# Patient Record
Sex: Male | Born: 1950 | Race: White | Hispanic: No | Marital: Married | Smoking: Never smoker
Health system: Southern US, Community
[De-identification: ages and names within clinical notes are randomized; demographics above are authoritative.]

## PROBLEM LIST (undated history)

## (undated) DIAGNOSIS — K922 Gastrointestinal hemorrhage, unspecified: Secondary | ICD-10-CM

## (undated) DIAGNOSIS — D493 Neoplasm of unspecified behavior of breast: Secondary | ICD-10-CM

## (undated) DIAGNOSIS — I2510 Atherosclerotic heart disease of native coronary artery without angina pectoris: Secondary | ICD-10-CM

## (undated) DIAGNOSIS — E65 Localized adiposity: Secondary | ICD-10-CM

## (undated) DIAGNOSIS — K219 Gastro-esophageal reflux disease without esophagitis: Secondary | ICD-10-CM

## (undated) DIAGNOSIS — N419 Inflammatory disease of prostate, unspecified: Secondary | ICD-10-CM

## (undated) DIAGNOSIS — I10 Essential (primary) hypertension: Secondary | ICD-10-CM

## (undated) DIAGNOSIS — Z8719 Personal history of other diseases of the digestive system: Secondary | ICD-10-CM

## (undated) DIAGNOSIS — Z951 Presence of aortocoronary bypass graft: Secondary | ICD-10-CM

## (undated) DIAGNOSIS — Z8711 Personal history of peptic ulcer disease: Secondary | ICD-10-CM

## (undated) HISTORY — DX: Neoplasm of unspecified behavior of breast: D49.3

## (undated) HISTORY — PX: IRRIGATION AND DEBRIDEMENT SEBACEOUS CYST: SHX5255

## (undated) HISTORY — DX: Personal history of peptic ulcer disease: Z87.11

## (undated) HISTORY — DX: Atherosclerotic heart disease of native coronary artery without angina pectoris: I25.10

## (undated) HISTORY — DX: Localized adiposity: E65

## (undated) HISTORY — DX: Presence of aortocoronary bypass graft: Z95.1

## (undated) HISTORY — DX: Personal history of other diseases of the digestive system: Z87.19

## (undated) HISTORY — PX: CARDIAC CATHETERIZATION: SHX172

---

## 1978-02-14 DIAGNOSIS — K922 Gastrointestinal hemorrhage, unspecified: Secondary | ICD-10-CM

## 1978-02-14 HISTORY — DX: Gastrointestinal hemorrhage, unspecified: K92.2

## 2005-07-22 ENCOUNTER — Emergency Department: Payer: Self-pay | Admitting: Emergency Medicine

## 2005-09-27 ENCOUNTER — Ambulatory Visit: Payer: Self-pay | Admitting: Physician Assistant

## 2006-01-04 ENCOUNTER — Encounter: Payer: Self-pay | Admitting: Family Medicine

## 2007-06-01 ENCOUNTER — Ambulatory Visit: Payer: Self-pay | Admitting: Family Medicine

## 2007-06-01 DIAGNOSIS — K219 Gastro-esophageal reflux disease without esophagitis: Secondary | ICD-10-CM

## 2007-06-01 DIAGNOSIS — M1990 Unspecified osteoarthritis, unspecified site: Secondary | ICD-10-CM | POA: Insufficient documentation

## 2007-06-01 DIAGNOSIS — J329 Chronic sinusitis, unspecified: Secondary | ICD-10-CM | POA: Insufficient documentation

## 2007-06-01 DIAGNOSIS — K279 Peptic ulcer, site unspecified, unspecified as acute or chronic, without hemorrhage or perforation: Secondary | ICD-10-CM | POA: Insufficient documentation

## 2007-06-01 DIAGNOSIS — I10 Essential (primary) hypertension: Secondary | ICD-10-CM | POA: Insufficient documentation

## 2007-06-01 DIAGNOSIS — M7710 Lateral epicondylitis, unspecified elbow: Secondary | ICD-10-CM | POA: Insufficient documentation

## 2007-06-01 DIAGNOSIS — J309 Allergic rhinitis, unspecified: Secondary | ICD-10-CM | POA: Insufficient documentation

## 2007-06-15 ENCOUNTER — Ambulatory Visit: Payer: Self-pay | Admitting: Internal Medicine

## 2007-06-15 ENCOUNTER — Telehealth (INDEPENDENT_AMBULATORY_CARE_PROVIDER_SITE_OTHER): Payer: Self-pay | Admitting: *Deleted

## 2007-06-27 ENCOUNTER — Telehealth: Payer: Self-pay | Admitting: Family Medicine

## 2007-07-22 ENCOUNTER — Emergency Department: Payer: Self-pay | Admitting: Emergency Medicine

## 2007-07-25 ENCOUNTER — Encounter: Payer: Self-pay | Admitting: Family Medicine

## 2007-08-03 ENCOUNTER — Ambulatory Visit: Payer: Self-pay | Admitting: Family Medicine

## 2007-08-03 DIAGNOSIS — R7309 Other abnormal glucose: Secondary | ICD-10-CM | POA: Insufficient documentation

## 2007-08-03 DIAGNOSIS — E785 Hyperlipidemia, unspecified: Secondary | ICD-10-CM

## 2007-08-03 LAB — CONVERTED CEMR LAB
ALT: 34 units/L (ref 0–53)
AST: 23 units/L (ref 0–37)
Bilirubin, Direct: 0.2 mg/dL (ref 0.0–0.3)
CO2: 29 meq/L (ref 19–32)
Chloride: 103 meq/L (ref 96–112)
GFR calc non Af Amer: 74 mL/min
LDL Cholesterol: 142 mg/dL — ABNORMAL HIGH (ref 0–99)
Potassium: 4 meq/L (ref 3.5–5.1)
Sodium: 138 meq/L (ref 135–145)
Total Bilirubin: 1.4 mg/dL — ABNORMAL HIGH (ref 0.3–1.2)
Total CHOL/HDL Ratio: 6.9

## 2007-08-24 ENCOUNTER — Ambulatory Visit: Payer: Self-pay | Admitting: Family Medicine

## 2007-09-06 ENCOUNTER — Telehealth: Payer: Self-pay | Admitting: Family Medicine

## 2007-09-21 ENCOUNTER — Ambulatory Visit: Payer: Self-pay | Admitting: Family Medicine

## 2007-09-24 ENCOUNTER — Telehealth: Payer: Self-pay | Admitting: Family Medicine

## 2007-09-26 ENCOUNTER — Telehealth: Payer: Self-pay | Admitting: Family Medicine

## 2007-09-28 ENCOUNTER — Ambulatory Visit: Payer: Self-pay | Admitting: Family Medicine

## 2007-10-01 LAB — CONVERTED CEMR LAB
BUN: 18 mg/dL (ref 6–23)
CO2: 29 meq/L (ref 19–32)
Calcium: 9.3 mg/dL (ref 8.4–10.5)
GFR calc Af Amer: 89 mL/min
Glucose, Bld: 107 mg/dL — ABNORMAL HIGH (ref 70–99)

## 2007-10-08 ENCOUNTER — Telehealth: Payer: Self-pay | Admitting: Family Medicine

## 2007-11-16 ENCOUNTER — Ambulatory Visit: Payer: Self-pay | Admitting: Family Medicine

## 2007-11-21 LAB — CONVERTED CEMR LAB
Direct LDL: 148 mg/dL
Glucose, Bld: 101 mg/dL — ABNORMAL HIGH (ref 70–99)
HDL: 29.9 mg/dL — ABNORMAL LOW (ref >39.0)
Total CHOL/HDL Ratio: 6.8
Triglycerides: 57 mg/dL (ref 0–149)

## 2007-11-23 ENCOUNTER — Ambulatory Visit: Payer: Self-pay | Admitting: Family Medicine

## 2007-11-23 DIAGNOSIS — G4700 Insomnia, unspecified: Secondary | ICD-10-CM

## 2007-11-30 ENCOUNTER — Ambulatory Visit: Payer: Self-pay | Admitting: Family Medicine

## 2007-11-30 LAB — CONVERTED CEMR LAB
OCCULT 1: NEGATIVE
OCCULT 2: NEGATIVE

## 2007-12-03 ENCOUNTER — Ambulatory Visit: Payer: Self-pay | Admitting: Family Medicine

## 2007-12-06 ENCOUNTER — Encounter (INDEPENDENT_AMBULATORY_CARE_PROVIDER_SITE_OTHER): Payer: Self-pay | Admitting: *Deleted

## 2007-12-10 ENCOUNTER — Telehealth: Payer: Self-pay | Admitting: Family Medicine

## 2007-12-10 DIAGNOSIS — R05 Cough: Secondary | ICD-10-CM

## 2007-12-14 ENCOUNTER — Encounter: Payer: Self-pay | Admitting: Family Medicine

## 2007-12-21 ENCOUNTER — Ambulatory Visit: Payer: Self-pay | Admitting: Family Medicine

## 2007-12-21 DIAGNOSIS — F341 Dysthymic disorder: Secondary | ICD-10-CM

## 2007-12-25 ENCOUNTER — Emergency Department: Payer: Self-pay | Admitting: Emergency Medicine

## 2008-01-25 ENCOUNTER — Telehealth: Payer: Self-pay | Admitting: Family Medicine

## 2008-02-07 ENCOUNTER — Telehealth: Payer: Self-pay | Admitting: Family Medicine

## 2008-02-15 HISTORY — PX: NASAL SINUS SURGERY: SHX719

## 2008-03-03 ENCOUNTER — Telehealth: Payer: Self-pay | Admitting: Family Medicine

## 2008-03-25 ENCOUNTER — Ambulatory Visit: Payer: Self-pay | Admitting: Family Medicine

## 2008-05-30 ENCOUNTER — Telehealth: Payer: Self-pay | Admitting: Family Medicine

## 2008-06-13 ENCOUNTER — Telehealth: Payer: Self-pay | Admitting: Family Medicine

## 2008-07-16 ENCOUNTER — Ambulatory Visit: Payer: Self-pay | Admitting: Family Medicine

## 2008-07-16 DIAGNOSIS — K047 Periapical abscess without sinus: Secondary | ICD-10-CM

## 2008-10-08 ENCOUNTER — Telehealth: Payer: Self-pay | Admitting: Family Medicine

## 2009-01-02 ENCOUNTER — Ambulatory Visit: Payer: Self-pay | Admitting: Family Medicine

## 2009-01-06 LAB — CONVERTED CEMR LAB
ALT: 30 units/L (ref 0–53)
AST: 24 units/L (ref 0–37)
Albumin: 4.3 g/dL (ref 3.5–5.2)
BUN: 21 mg/dL (ref 6–23)
Chloride: 104 meq/L (ref 96–112)
Cholesterol: 207 mg/dL — ABNORMAL HIGH (ref 0–200)
Creatinine, Ser: 1.1 mg/dL (ref 0.4–1.5)
Glucose, Bld: 106 mg/dL — ABNORMAL HIGH (ref 70–99)
Potassium: 3.7 meq/L (ref 3.5–5.1)
Total Bilirubin: 1.1 mg/dL (ref 0.3–1.2)
Triglycerides: 71 mg/dL (ref 0.0–149.0)

## 2009-01-15 ENCOUNTER — Ambulatory Visit: Payer: Self-pay | Admitting: Family Medicine

## 2009-02-14 DIAGNOSIS — N419 Inflammatory disease of prostate, unspecified: Secondary | ICD-10-CM

## 2009-02-14 HISTORY — DX: Inflammatory disease of prostate, unspecified: N41.9

## 2009-03-06 ENCOUNTER — Telehealth: Payer: Self-pay | Admitting: Family Medicine

## 2009-04-10 ENCOUNTER — Ambulatory Visit: Payer: Self-pay | Admitting: Family Medicine

## 2009-04-14 LAB — CONVERTED CEMR LAB
Direct LDL: 158.6 mg/dL
HDL: 36.3 mg/dL — ABNORMAL LOW (ref >39.00)
Total CHOL/HDL Ratio: 6
Triglycerides: 117 mg/dL (ref 0.0–149.0)
VLDL: 23.4 mg/dL (ref 0.0–40.0)

## 2009-04-24 ENCOUNTER — Ambulatory Visit: Payer: Self-pay | Admitting: Family Medicine

## 2009-04-27 ENCOUNTER — Emergency Department: Payer: Self-pay | Admitting: Emergency Medicine

## 2009-04-29 ENCOUNTER — Ambulatory Visit: Payer: Self-pay | Admitting: Family Medicine

## 2009-04-29 LAB — CONVERTED CEMR LAB: Cholesterol, target level: 200 mg/dL

## 2009-05-14 ENCOUNTER — Ambulatory Visit: Payer: Self-pay | Admitting: Otolaryngology

## 2009-09-18 ENCOUNTER — Telehealth: Payer: Self-pay | Admitting: Family Medicine

## 2010-03-16 NOTE — Progress Notes (Signed)
Summary: stung by several bees  Phone Note Call from Patient   Caller: Spouse Call For: Kerby Nora MD Summary of Call: Pt's wife states pt was stung several times on his face this morning by some type of bees.  His face is very swollen, no breathing problems. Has taken 2 benedryl.   Advised wife that pt should go to urgent care for evaluation, since face is severly swollen. Initial call taken by: Lowella Petties CMA,  September 18, 2009 12:42 PM  Follow-up for Phone Call        Can add him to 4:15 slot here if he has not already gone to urgent care.  Follow-up by: Kerby Nora MD,  September 18, 2009 12:51 PM  Additional Follow-up for Phone Call Additional follow up Details #1::        Patient already been to urgent care.Consuello Masse CMA   Additional Follow-up by: Benny Lennert CMA Duncan Dull),  September 18, 2009 2:13 PM

## 2010-03-16 NOTE — Assessment & Plan Note (Signed)
Summary: ? SINUS INFECTION/ 2:15   Vital Signs:  Patient profile:   60 year old male Height:      73 inches Weight:      239 pounds BMI:     31.65 Temp:     97.6 degrees F oral Pulse rate:   92 / minute Pulse rhythm:   regular BP sitting:   142 / 78  (left arm) Cuff size:   large  Vitals Entered By: Delilah Shan CMA Duncan Dull) (April 24, 2009 2:14 PM) CC: ? sinus infection   History of Present Illness: 60 yo with recurrent sinusitis comes in with 2 weeks of worsening sinus pressure. Has been on amoxicillin three times over past year for similar symptoms. Went to ENT years ago, told he needed sinus surgery but he backed out at a last minute. Felt feverish last night.  No cough. No SOB or CP. Pressure behind eyes right now.  Ibuprofen helps a little. No blurred vision or pain with chewing.  Current Medications (verified): 1)  Hydrochlorothiazide 25 Mg Tabs (Hydrochlorothiazide) .... Take 1 Tablet By Mouth Once A Day 2)  Metoprolol Succinate 50 Mg Xr24h-Tab (Metoprolol Succinate) .... Take 1 Tablet By Mouth Once A Day 3)  Sertraline Hcl 50 Mg Tabs (Sertraline Hcl) .Marland Kitchen.. 1 Tab By Mouth Daily 4)  Omeprazole 40 Mg Cpdr (Omeprazole) .Marland Kitchen.. 1 Tab By Mouth Daily 5)  Augmentin 875-125 Mg Tabs (Amoxicillin-Pot Clavulanate) .Marland Kitchen.. 1 By Mouth 2 Times Daily X 10 Days  Allergies: 1)  ! Codeine  Review of Systems      See HPI General:  Complains of chills; denies fever. ENT:  Complains of nasal congestion and sinus pressure; denies sore throat. CV:  Denies chest pain or discomfort. Resp:  Denies cough, shortness of breath, sputum productive, and wheezing.  Physical Exam  General:  Overwieght man with central obesity in NAD Ears:  External ear exam shows no significant lesions or deformities.  Otoscopic examination reveals clear canals, tympanic membranes are intact bilaterally without bulging, retraction, inflammation or discharge. Hearing is grossly normal bilaterally. Nose:  nasal  dischargemucosal pallor.   sinuses tender throughout Mouth:  MMM, poor dentition Lungs:  Normal respiratory effort, chest expands symmetrically. Lungs are clear to auscultation, no crackles or wheezes. Heart:  Normal rate and regular rhythm. S1 and S2 normal without gallop, murmur, click, rub or other extra sounds. Extremities:  no edema  Cervical Nodes:  No lymphadenopathy noted Psych:  normally interactive and good eye contact.     Impression & Recommendations:  Problem # 1:  SINUSITIS, CHRONIC (ICD-473.9) Assessment Deteriorated Will try Augmentin, stronly encouraged him to follow up with ENT to proceed with sinus surgery. Continue supportive care. The following medications were removed from the medication list:    Amoxicillin 500 Mg Caps (Amoxicillin) .Marland Kitchen... 2 tab by mouth two times a day x 14 days His updated medication list for this problem includes:    Augmentin 875-125 Mg Tabs (Amoxicillin-pot clavulanate) .Marland Kitchen... 1 by mouth 2 times daily x 10 days  Complete Medication List: 1)  Hydrochlorothiazide 25 Mg Tabs (Hydrochlorothiazide) .... Take 1 tablet by mouth once a day 2)  Metoprolol Succinate 50 Mg Xr24h-tab (Metoprolol succinate) .... Take 1 tablet by mouth once a day 3)  Sertraline Hcl 50 Mg Tabs (Sertraline hcl) .Marland Kitchen.. 1 tab by mouth daily 4)  Omeprazole 40 Mg Cpdr (Omeprazole) .Marland Kitchen.. 1 tab by mouth daily 5)  Augmentin 875-125 Mg Tabs (Amoxicillin-pot clavulanate) .Marland Kitchen.. 1 by mouth 2 times daily  x 10 days Prescriptions: AUGMENTIN 875-125 MG TABS (AMOXICILLIN-POT CLAVULANATE) 1 by mouth 2 times daily x 10 days  #20 x 0   Entered and Authorized by:   Ruthe Mannan MD   Signed by:   Ruthe Mannan MD on 04/24/2009   Method used:   Electronically to        [facility] [address] (retail)       [address]       [place], [place]  [address]       Ph: [phone]       Fax: [phone]   RxID:   [id number]   Current Allergies (reviewed  today): ! CODEINE

## 2010-03-16 NOTE — Progress Notes (Signed)
Summary: ? if had the flu  Phone Note Call from Patient Call back at [phone]   Caller: Patient Call For: Kerby Nora MD Summary of Call: Pt's wife diagnosed with flu and now pt  has productive cough with yellow mucus, pt feels achy and so tired can't be up and about. No sorethroat, but pt feels warm  has not checked fever. Pt is not having any wheezing or no problem getting his breath. Head is congested. Pt would like med called in. Pt uses [facility] [address] [phone]. Please advise.  Initial call taken by: Lewanda Rife LPN,  March 06, 2009 1:36 PM  Follow-up for Phone Call        How long has he had symptoms? if flu... only thing available  is tamiflu, but only helpful if used in first 48-72 hours of symptoms.  Follow-up by: Kerby Nora MD,  March 06, 2009 2:05 PM  Additional Follow-up for Phone Call Additional follow up Details #1::        just about 2 days ago Additional Follow-up by: Benny Lennert CMA Duncan Dull),  March 06, 2009 2:09 PM    Additional Follow-up for Phone Call Additional follow up Details #2::    Sent in tamiflu if pt interested..main treatment is fluids, rest and symptoms control...if severe nighttime cough, we can also call in cough suppressant.  Follow-up by: Kerby Nora MD,  March 06, 2009 2:18 PM  Additional Follow-up for Phone Call Additional follow up Details #3:: Details for Additional Follow-up Action Taken: patient advised.Consuello Masse CMA  Additional Follow-up by: Benny Lennert CMA Duncan Dull),  March 06, 2009 2:28 PM  New/Updated Medications: TAMIFLU 75 MG CAPS (OSELTAMIVIR PHOSPHATE) 1 tab by mouth twice  daily x 5 days Prescriptions: TAMIFLU 75 MG CAPS (OSELTAMIVIR PHOSPHATE) 1 tab by mouth twice  daily x 5 days  #10 x 0   Entered and Authorized by:   Kerby Nora MD   Signed by:   Kerby Nora MD on 03/06/2009   Method used:   Electronically to        [facility] [address] (retail)       [address], [address]  [place], [place]  [address]       Ph: [phone]       Fax: [phone]   RxID:   [id number]

## 2010-03-16 NOTE — Assessment & Plan Note (Signed)
Summary: 3 M F/U DLO   Vital Signs:  Patient profile:   60 year old male Height:      73 inches Weight:      235.0 pounds BMI:     31.12 Temp:     98.0 degrees F oral Pulse rate:   72 / minute Pulse rhythm:   regular BP sitting:   120 / 70  (left arm) Cuff size:   large  Vitals Entered By: Benny Lennert CMA Duncan Dull) (April 29, 2009 11:01 AM)  History of Present Illness: Chief complaint 3 month follow up  ON augmentin  for 5/ 10 days for chronic sinusitis. Has appt today with ENT.Marland Kitchen in [place].  Symptoms have improved significantly.  Dizzyness resolved from IV fluid received in [facility].. last week. Feeling much better.  Mood much better .Marland Kitchenwants to wean off medciation.  NO SI, no insomnia.   Hypertension History:      He denies headache, chest pain, dyspnea with exertion, orthopnea, peripheral edema, and side effects from treatment.  Not following at home.        Positive major cardiovascular risk factors include male age 75 years old or older, hyperlipidemia, and hypertension.  Negative major cardiovascular risk factors include non-tobacco-user status.    Lipid Management History:      Positive NCEP/ATP III risk factors include male age 66 years old or older, HDL cholesterol less than 40, and hypertension.  Negative NCEP/ATP III risk factors include non-tobacco-user status.        Comments: Has been increasing walking..joined [place] and exercsiing once a week. .   Problems Prior to Update: 1)  Gerd  (ICD-530.81) 2)  Abscess, Tooth  (ICD-522.5) 3)  Anxiety Depression  (ICD-300.4) 4)  Cough, Chronic  (ICD-786.2) 5)  Insomnia  (ICD-780.52) 6)  Special Screening Malignant Neoplasm of Prostate  (ICD-V76.44) 7)  Prediabetes  (ICD-790.29) 8)  Hypercholesterolemia  (ICD-272.0) 9)  Lateral Epicondylitis of Elbow  (ICD-726.32) 10)  Allergic Rhinitis  (ICD-477.9) 11)  Sinusitis, Chronic  (ICD-473.9) 12)  Hypertension  (ICD-401.9) 13)  Peptic Ulcer Disease  (ICD-533.90) 14)  Gerd   (ICD-530.81) 15)  Osteoarthritis  (ICD-715.90)  Current Medications (verified): 1)  Hydrochlorothiazide 25 Mg Tabs (Hydrochlorothiazide) .... Take 1 Tablet By Mouth Once A Day 2)  Metoprolol Succinate 50 Mg Xr24h-Tab (Metoprolol Succinate) .... Take 1 Tablet By Mouth Once A Day 3)  Sertraline Hcl 50 Mg Tabs (Sertraline Hcl) .Marland Kitchen.. 1 Tab By Mouth Daily 4)  Omeprazole 40 Mg Cpdr (Omeprazole) .Marland Kitchen.. 1 Tab By Mouth Daily 5)  Chantix 1 Mg Tabs (Varenicline Tartrate) .Marland Kitchen.. 1 Tab By Mouth Am..no Nighttime Dose.  Allergies: 1)  ! Codeine  Past History:  Past medical, surgical, family and social histories (including risk factors) reviewed, and no changes noted (except as noted below).  Past Medical History: Reviewed history from 06/01/2007 and no changes required. Osteoarthritis GERD Peptic ulcer disease Hypertension Allergic rhinitis  Past Surgical History: Reviewed history from 06/01/2007 and no changes required. stress test 2008 CT scan of sinuses: blockage  Family History: Reviewed history from 06/01/2007 and no changes required. adopted  Social History: Reviewed history from 06/01/2007 and no changes required. Occupation:Plumbing/mechanical Married 3 kids: Son with DM, daughter prediabetic Never Smoked Alcohol use-yes, 6-7 beers 2-3 times a week Drug use-no Regular exercise-yes, softball 2 times a week Diet: some fast food on weekends, water  Review of Systems General:  Denies fatigue and fever. CV:  Denies chest pain or discomfort. Resp:  Denies shortness  of breath. GI:  Denies abdominal pain. GU:  Denies dysuria.  Physical Exam  General:  Overwieght man with central obesity in NAD Head:  No maxillary sinus ttp Eyes:  No corneal or conjunctival inflammation noted. EOMI. Perrla. Funduscopic exam benign, without hemorrhages, exudates or papilledema. Vision grossly normal. Ears:  External ear exam shows no significant lesions or deformities.  Otoscopic examination  reveals clear canals, tympanic membranes are intact bilaterally without bulging, retraction, inflammation or discharge. Hearing is grossly normal bilaterally. Nose:  nasal dischargemucosal pallor.   Mouth:  MMM, poor dentition Neck:  no carotid bruit or thyromegaly no cervical or supraclavicular lymphadenopathy  Lungs:  Normal respiratory effort, chest expands symmetrically. Lungs are clear to auscultation, no crackles or wheezes. Heart:  Normal rate and regular rhythm. S1 and S2 normal without gallop, murmur, click, rub or other extra sounds. Pulses:  R and L posterior tibial pulses are full and equal bilaterally  Extremities:  no edema   Impression & Recommendations:  Problem # 1:  ANXIETY DEPRESSION (ICD-300.4) Doing well, discussed weaning off medicaiton slowly. Decrease to 1/2 tab by mouth daily of sertraline  x 1-2  week then take 1/2 tab every other day x 1 week then off.    Problem # 2:  HYPERCHOLESTEROLEMIA (ICD-272.0) Inadequate control. Wishes to try lifestyle changes prior to medication.  Reviewed cholesterol diet. Increase exercise. Work on [employer]. Labs Reviewed: SGOT: 24 (01/02/2009)   SGPT: 30 (01/02/2009)  Lipid Goals: Chol Goal: 200 (04/29/2009)   HDL Goal: 40 (04/29/2009)   LDL Goal: 130 (04/29/2009)   TG Goal: 150 (04/29/2009)  Prior 10 Yr Risk Heart Disease: Not enough information (01/15/2009)   HDL:36.30 (04/10/2009), 28.30 (01/02/2009)  LDL:DEL (11/16/2007), 142 (08/03/2007)  Chol:208 (04/10/2009), 207 (01/02/2009)  Trig:117.0 (04/10/2009), 71.0 (01/02/2009)  Problem # 3:  HYPERTENSION (ICD-401.9) Well controlled. Continue current medication.  His updated medication list for this problem includes:    Hydrochlorothiazide 25 Mg Tabs (Hydrochlorothiazide) .Marland Kitchen... Take 1 tablet by mouth once a day    Metoprolol Succinate 50 Mg Xr24h-tab (Metoprolol succinate) .Marland Kitchen... Take 1 tablet by mouth once a day  BP today: 120/70 Prior BP: 142/78 (04/24/2009)  Prior 10 Yr Risk  Heart Disease: Not enough information (01/15/2009)  Labs Reviewed: K+: 3.7 (01/02/2009) Creat: : 1.1 (01/02/2009)   Chol: 208 (04/10/2009)   HDL: 36.30 (04/10/2009)   LDL: DEL (11/16/2007)   TG: 117.0 (04/10/2009)  Problem # 4:  SINUSITIS, CHRONIC (ICD-473.9) Per ENT.   Complete Medication List: 1)  Hydrochlorothiazide 25 Mg Tabs (Hydrochlorothiazide) .... Take 1 tablet by mouth once a day 2)  Metoprolol Succinate 50 Mg Xr24h-tab (Metoprolol succinate) .... Take 1 tablet by mouth once a day 3)  Sertraline Hcl 50 Mg Tabs (Sertraline hcl) .Marland Kitchen.. 1 tab by mouth daily 4)  Omeprazole 40 Mg Cpdr (Omeprazole) .Marland Kitchen.. 1 tab by mouth daily 5)  Chantix 1 Mg Tabs (Varenicline tartrate) .Marland Kitchen.. 1 tab by mouth am..no nighttime dose.  Hypertension Assessment/Plan:      The patient's hypertensive risk group is category B: At least one risk factor (excluding diabetes) with no target organ damage.  Today's blood pressure is 120/70.  His blood pressure goal is < 140/90.  Lipid Assessment/Plan:      Based on NCEP/ATP III, the patient's risk factor category is "0-1 risk factors".  The patient's lipid goals are as follows: Total cholesterol goal is 200; LDL cholesterol goal is 130; HDL cholesterol goal is 40; Triglyceride goal is 150.  His LDL cholesterol  goal has not been met.     Patient Instructions: 1)  Decrease to 1/2 tab by mouth daily of sertraline  x 1-2  week then take 1/2 tab every other day x 1 week then off.   2)  Follow BPs at home.  3)  Increase exercise. Work on [employer]. 4)  Take multivitamin, B complex.  5)  Recheck fasting LIPIDS, CMET, TSH  in 3 months Dx 272.0    6)  Please schedule a follow-up appointment in 3-4 months CPX.    Current Allergies (reviewed today): ! CODEINE

## 2011-01-03 ENCOUNTER — Ambulatory Visit: Payer: Self-pay | Admitting: Cardiovascular Disease

## 2011-01-13 HISTORY — PX: NM MYOCAR MULTIPLE W/SPECT: HXRAD626

## 2011-01-15 DIAGNOSIS — Z951 Presence of aortocoronary bypass graft: Secondary | ICD-10-CM

## 2011-01-15 HISTORY — DX: Presence of aortocoronary bypass graft: Z95.1

## 2011-01-27 ENCOUNTER — Other Ambulatory Visit: Payer: Self-pay | Admitting: Cardiology

## 2011-01-28 ENCOUNTER — Encounter (HOSPITAL_COMMUNITY): Payer: Self-pay | Admitting: Pharmacy Technician

## 2011-01-31 ENCOUNTER — Other Ambulatory Visit: Payer: Self-pay | Admitting: Cardiology

## 2011-01-31 ENCOUNTER — Ambulatory Visit
Admission: RE | Admit: 2011-01-31 | Discharge: 2011-01-31 | Disposition: A | Discharge status: Home / Self Care | Payer: PRIVATE HEALTH INSURANCE | Source: Ambulatory Visit | Attending: Cardiology | Admitting: Cardiology

## 2011-01-31 DIAGNOSIS — Z01818 Encounter for other preprocedural examination: Secondary | ICD-10-CM

## 2011-01-31 DIAGNOSIS — R0602 Shortness of breath: Secondary | ICD-10-CM | POA: Diagnosis present

## 2011-01-31 DIAGNOSIS — R9439 Abnormal result of other cardiovascular function study: Secondary | ICD-10-CM | POA: Diagnosis present

## 2011-02-01 NOTE — H&P (Addendum)
History and Physical Interval Note:  NAME:  Joel Walters   MRN: [id number] DOB:  [birth year]   ADMIT DATE: 02/02/2011   Reinhard Schack is a 60 y.o. male with a PMH of HTN, HLD, and obesity who was seen by me at [facility] for evaluation of chest pressure and shortness of breath on exertion.  He underwent TM Myoview Stress testing that demonstrated a large sized, moderate intensity perfusion defect in the basal to apical inferoseptal & inferior walls with moderate reversibility.  A defect consistent with RCA or dominant LCx distribution ischemia, and was considered to be high risk, with cardiac catheterization advised.    He has presented today for cardiac catheterization, with the diagnosis of dyspnea on exertion with chest pressure.   The various methods of treatment have been discussed with the patient and family. After consideration of risks, benefits and other options for treatment, the patient has consented to Procedure(s):  LEFT HEART CATHETERIZATION AND CORONARY ANGIOGRAPHY +/- AD HOC PERCUTANEOUS CORONARY INTERVENTION as a surgical intervention.   The patients' history has been reviewed, patient examined, no change in status from most recent note (dated 01/26/11), he is stable for planned procedure. I have reviewed the patients' chart and labs. Questions were answered to the patient's satisfaction.   BUN/Cr 15/1.11, K 4.5; WBC/Hgb/Hct/Plt  6.8/17.1/49.1/301.  General appearance: alert, cooperative, appears stated age and no distress Neck: no adenopathy, no carotid bruit, no JVD, supple, symmetrical, trachea midline and thyroid not enlarged, symmetric, no tenderness/mass/nodules Lungs: clear to auscultation bilaterally and non-labored Heart: regular rate and rhythm, S1, S2 normal, no murmur, click, rub or gallop Abdomen: soft, non-tender; bowel sounds normal; no masses,  no organomegaly Extremities: extremities normal, atraumatic, no cyanosis or edema Pulses: 2+ and symmetric Neurologic: Alert and  oriented X 3, normal strength and tone. Normal symmetric reflexes. Normal coordination and gait   PLAN: Proceed with elective LHC +/- PCI  CARDIAC CATHETERIZATION CONSENT NOTE: Performing MD:  Marykay Lex, M.D., M.S.  Procedure(s):   LEFT HEART CATHETERIZATION WITH CORONARY ANGIOGRAPY LEFT VENTRICULOGRAPHY POSSIBLE AD HOC PERCUTANEOUS CORONARY INTERVENTION.  The procedure with Risks/Benefits/Alternatives and Indications was reviewed with the patient and companion.  All questions were answered.    Risks / Complications include, but not limited to: Death, MI, CVA/TIA, VF/VT (with defibrillation), Bradycardia (need for temporary pacer placement), contrast induced nephropathy, bleeding / bruising / hematoma / pseudoaneurysm, vascular or coronary injury (with possible emergent CT or Vascular Surgery), adverse medication reactions, infection.    The patient voiced understanding and agree to proceed.   I have signed the consent form and placed it on the chart for patient signature and RN witness.     Marykay Lex, M.D., M.S. [facility] [address] [place], [place]  [address]  [phone]

## 2011-02-02 ENCOUNTER — Encounter (HOSPITAL_COMMUNITY): Payer: Self-pay | Admitting: Anesthesiology

## 2011-02-02 ENCOUNTER — Ambulatory Visit (HOSPITAL_COMMUNITY)
Admission: RE | Admit: 2011-02-02 | Discharge: 2011-02-02 | Disposition: A | Discharge status: Home / Self Care | Payer: PRIVATE HEALTH INSURANCE | Source: Ambulatory Visit | Attending: Cardiology | Admitting: Cardiology

## 2011-02-02 ENCOUNTER — Other Ambulatory Visit: Payer: Self-pay

## 2011-02-02 ENCOUNTER — Other Ambulatory Visit: Payer: Self-pay | Admitting: Cardiothoracic Surgery

## 2011-02-02 ENCOUNTER — Encounter (HOSPITAL_COMMUNITY)
Admission: RE | Disposition: A | Discharge status: Home / Self Care | Payer: Self-pay | Source: Ambulatory Visit | Attending: Cardiology

## 2011-02-02 DIAGNOSIS — I2510 Atherosclerotic heart disease of native coronary artery without angina pectoris: Secondary | ICD-10-CM | POA: Insufficient documentation

## 2011-02-02 DIAGNOSIS — R9439 Abnormal result of other cardiovascular function study: Secondary | ICD-10-CM | POA: Diagnosis present

## 2011-02-02 DIAGNOSIS — R0602 Shortness of breath: Secondary | ICD-10-CM | POA: Diagnosis present

## 2011-02-02 DIAGNOSIS — E785 Hyperlipidemia, unspecified: Secondary | ICD-10-CM | POA: Diagnosis present

## 2011-02-02 DIAGNOSIS — R7309 Other abnormal glucose: Secondary | ICD-10-CM | POA: Diagnosis present

## 2011-02-02 DIAGNOSIS — R079 Chest pain, unspecified: Secondary | ICD-10-CM | POA: Insufficient documentation

## 2011-02-02 DIAGNOSIS — I10 Essential (primary) hypertension: Secondary | ICD-10-CM | POA: Diagnosis present

## 2011-02-02 HISTORY — PX: LEFT HEART CATHETERIZATION WITH CORONARY ANGIOGRAM: SHX5451

## 2011-02-02 SURGERY — LEFT HEART CATHETERIZATION WITH CORONARY ANGIOGRAM
Anesthesia: LOCAL

## 2011-02-02 MED ORDER — NITROGLYCERIN 0.2 MG/ML ON CALL CATH LAB
INTRAVENOUS | Status: AC
  Filled 2011-02-02: qty 1

## 2011-02-02 MED ORDER — MORPHINE SULFATE 2 MG/ML IJ SOLN: 2.0000 mg | INTRAMUSCULAR | Status: DC | PRN

## 2011-02-02 MED ORDER — HEPARIN SODIUM (PORCINE) 1000 UNIT/ML IJ SOLN
INTRAMUSCULAR | Status: AC
  Filled 2011-02-02: qty 1

## 2011-02-02 MED ORDER — ASPIRIN 325 MG PO TABS: 325.0000 mg | ORAL_TABLET | Freq: Every day | ORAL | Status: DC

## 2011-02-02 MED ORDER — ROSUVASTATIN CALCIUM 20 MG PO TABS: 20.0000 mg | ORAL_TABLET | Freq: Every day | ORAL | Status: DC

## 2011-02-02 MED ORDER — SODIUM CHLORIDE 0.9 % IV SOLN
250.0000 mL | INTRAVENOUS | Status: DC | PRN
  Administered 2011-02-02: 1000 mL via INTRAVENOUS

## 2011-02-02 MED ORDER — MIDAZOLAM HCL 2 MG/2ML IJ SOLN
INTRAMUSCULAR | Status: AC
  Filled 2011-02-02: qty 2

## 2011-02-02 MED ORDER — FENTANYL CITRATE 0.05 MG/ML IJ SOLN
INTRAMUSCULAR | Status: AC
  Filled 2011-02-02: qty 2

## 2011-02-02 MED ORDER — ACETAMINOPHEN 325 MG PO TABS
650.0000 mg | ORAL_TABLET | ORAL | Status: DC | PRN
  Administered 2011-02-02: 650 mg via ORAL

## 2011-02-02 MED ORDER — SODIUM CHLORIDE 0.9 % IJ SOLN: 3.0000 mL | INTRAMUSCULAR | Status: DC | PRN

## 2011-02-02 MED ORDER — ONDANSETRON HCL 4 MG/2ML IJ SOLN: 4.0000 mg | Freq: Four times a day (QID) | INTRAMUSCULAR | Status: DC | PRN

## 2011-02-02 MED ORDER — ACETAMINOPHEN 325 MG PO TABS
ORAL_TABLET | ORAL | Status: AC
  Filled 2011-02-02: qty 2

## 2011-02-02 MED ORDER — METOPROLOL SUCCINATE ER 50 MG PO TB24: 50.0000 mg | ORAL_TABLET | Freq: Every day | ORAL | Status: DC

## 2011-02-02 MED ORDER — HYDROCHLOROTHIAZIDE 25 MG PO TABS: 25.0000 mg | ORAL_TABLET | Freq: Every day | ORAL | Status: DC

## 2011-02-02 MED ORDER — ASPIRIN 81 MG PO CHEW
CHEWABLE_TABLET | ORAL | Status: AC
  Filled 2011-02-02: qty 4

## 2011-02-02 MED ORDER — SODIUM CHLORIDE 0.9 % IV SOLN: 1.0000 mL/kg/h | INTRAVENOUS | Status: DC

## 2011-02-02 MED ORDER — DIAZEPAM 5 MG PO TABS
5.0000 mg | ORAL_TABLET | ORAL | Status: AC
  Administered 2011-02-02: 5 mg via ORAL
  Filled 2011-02-02: qty 1

## 2011-02-02 MED ORDER — HEPARIN (PORCINE) IN NACL 2-0.9 UNIT/ML-% IJ SOLN
INTRAMUSCULAR | Status: AC
  Filled 2011-02-02: qty 2000

## 2011-02-02 MED ORDER — VERAPAMIL HCL 2.5 MG/ML IV SOLN
INTRAVENOUS | Status: AC
  Filled 2011-02-02: qty 2

## 2011-02-02 MED ORDER — LIDOCAINE HCL (PF) 1 % IJ SOLN
INTRAMUSCULAR | Status: AC
  Filled 2011-02-02: qty 30

## 2011-02-02 MED ORDER — SODIUM CHLORIDE 0.9 % IV SOLN: INTRAVENOUS | Status: DC

## 2011-02-02 NOTE — Consult Note (Signed)
[address]            [place] [address]          [phone]      [name] Medical Record #[id number] Date of Birth: 08-28-1950  Referring: Dr Bryan Lemma Primary Care Dr Stanford Scotland Archinal  Chief Complaint:   Shortness of breath  History of Present Illness:    The patient is a 60 year old male with a history of hypertension obesity and hyperlipidemia as well as possible sleep apnea. Approximate 6 weeks ago he saw his primary care doctor because of vague complaints including confusion dyspnea on exertion arm discomfort and excessive sweating. He's also had some episodes of lightheadedness. He notes that he's had increasing fatigue. He notes since the original onset of symptoms he actually feels better now. He denies any definite chest pain. He was referred to Dr. Windy Carina. Myoview stress test was performed leading to cardiac catheterization today. I've been asked to see him in consideration of coronary artery bypass grafting.  He's had no known previous history of coronary artery disease denies previous stroke denies diabetes denies smoking history does have a known history of hyperlipidemia currently untreated and a history of hypertension.      Current Activity/ Functional Status: Patient is independent with mobility/ambulation, transfers, ADL's, IADL's.   Past medical history Hypertension  hyperlipidemia Question of sleep apnea though this has not been fully evaluated   Past surgical history Sinus surgery in 2011  Family history: Patient notes that he is adopted and so does not know any details of family history he does have 3 children 1 who at age 65 has diabetes.  History   Social History  . Marital Status: Single dating long term partner   Occupational History   Patient works in Production designer, theatre/television/film and apartment complex    Social History Main Topics  . Smoking status:  nonsmoker   . Smokeless tobacco: Not on file  .  Alcohol Use:  previously was in a rock band and notes heavy alcohol use but no alcohol use for the past 5 years   . Drug Use:  denies drug use            Allergies  Allergen Reactions  . Codeine     REACTION: Jittery, itches    Prescriptions prior to admission  Medication Sig Dispense Refill  . aspirin 325 MG tablet Take 325 mg by mouth daily.        . hydrochlorothiazide (HYDRODIURIL) 25 MG tablet Take 25 mg by mouth daily.        . metoprolol (TOPROL-XL) 50 MG 24 hr tablet Take 50 mg by mouth daily.        . Multiple Vitamins-Minerals (MULTIVITAMINS THER. W/MINERALS) TABS Take 1 tablet by mouth daily.        . vitamin C (ASCORBIC ACID) 500 MG tablet Take 500 mg by mouth daily.             Review of Systems:     Cardiac Review of Systems: Y or N  Chest Pain [  n  ]  Resting SOB [n   ] Exertional SOB  [ y ]  Pollyann Kennedy [name]  ]   Pedal Edema [ n  ]    Palpitations [n  ] Syncope  [name]  ]   Presyncope [name]   ]  General Review  of Systems: [Y] = yes [  ]=no Constitional: recent weight change [ n ]; anorexia [ n ]; fatigue [name]  ]; nausea [ y ]; night sweats [name]  ]; fever [ nn ]; or chills [ n ];                                                                                                                                          Dental: poor dentition[n  ];  Eye : blurred vision [ n ]; diplopia [  n ]; vision changes [ n ];  Amaurosis fugax[ n]; Resp: cough [n  ];  wheezing[ n ];  hemoptysis[ n ]; shortness of breath[  n]; paroxysmal nocturnal dyspnea[ n ]; dyspnea on exertion[y  ]; or orthopnea[n  ];  GI:  gallstones[  ], vomiting[  ];  dysphagia[  ]; melena[  ];  hematochezia [  ]; heartburn[  ];   Hx of  Colonoscopy[ n ]; GU: kidney stones [  ]; hematuria[  ];   dysuria [  ];  nocturia[y  ];  history of     obstruction [n ];             Skin: rash, swelling[  ];, hair loss[  ];  peripheral edema[  ];  or itching[  ]; Musculosketetal: myalgias[  ];  joint swelling[  ];  joint erythema[   ];  joint pain[  ];  back pain[  ];  Heme/Lymph: bruising[  ];  bleeding[  ];  anemia[  ];  Neuro: TIA[n  ];  headaches[ n ];  stroke[n  ];  vertigo[  ];  seizures[  ];   paresthesias[y   Arm biltaerial];  difficulty walking[  n];  Psych:depression[ n ]; anxiety[ n ];  Endocrine: diabetes[ n ];  thyroid dysfunction[  n];  Immunizations: Flu [ n ]; Pneumococcal[ n ];  Other:  Physical Exam: BP 136/82  Pulse 81  Temp(Src) 97.1 F (36.2 C) (Oral)  Resp 18  Ht 6\' 1"  (1.854 m)  Wt 240 lb (108.863 kg)  BMI 31.66 kg/m2  SpO2 98%  General appearance: alert, cooperative, appears older than stated age and no distress Neurologic: intact the patient has no carotid bruits Heart: regular rate and rhythm, S1, S2 normal, no murmur, click, rub or gallop and normal apical impulse Lungs: clear to auscultation bilaterally Abdomen: soft, non-tender; bowel sounds normal; no masses,  no organomegaly Extremities: extremities normal, atraumatic, no cyanosis or edema, Homans sign is negative, no sign of DVT, no edema, redness or tenderness in the calves or thighs and no ulcers, gangrene or trophic changes Wound: rt wrist cath site without hematoma   Diagnostic Studies & Laboratory data:     Recent Radiology Findings:   Myoview stress test was performed Dr. Elissa Hefty office Baseline EKG was normal stress EKG showed no changes stress images show a large moderate intensity  perfusion defect in the basal inferior septal basal inferior and mid inferior septal mid inferior and apical inferior walls resting images reveal moderate defect reversibility ejection fraction is measured at 54%  02/02/2011  1:39 PM  PATIENT: Joel Walters 60 y.o. male  PRE-OPERATIVE DIAGNOSIS: Chest pain  POST-OPERATIVE DIAGNOSIS: Multivessel CAD  100%mRCA, 80% early mid LAD; Bifurcation Cx-OM1 80% & OM2 prox 90%; LPL-RPL collaterals fill RPL & RPDA  EF 60-65%; Inferobasal HK, normal LVEDP  Procedure(s):  LEFT HEART  CATHETERIZATION WITH CORONARY ANGIOGRAM  INTRA CORONARY NITROGLYCERIN INJECTION  Surgeon(s):  Marykay Lex  ANESTHESIA: local and IV sedation; 6mg  Versed; 150 mcg Fentanyl; PO Valium 5mg   EBL: <10 ml  LOCAL MEDICATIONS USED: LIDOCAINE 3 ml  SPECIMEN: No Specimen  TOURNIQUET: TR Band: 14 ml air; 1318 hrs.  DICTATION: .Note written in EPIC  PLAN OF CARE: Will consult CVTS to see in Short stay  PATIENT DISPOSITION: Short-stay; no d/c until sen by CT Sgx  Delay start of Pharmacological VTE agent (>24hrs) due to surgical blood loss or risk of bleeding: Not Applicable        Recent Lab Findings: Lab Results  Component Value Date   GLUCOSE 106* 01/02/2009   CHOL 208* 04/10/2009   TRIG 117.0 04/10/2009   HDL 36.30* 04/10/2009   LDLDIRECT 158.6 04/10/2009   LDLCALC 142* 08/03/2007   ALT 30 01/02/2009   AST 24 01/02/2009   NA 142 01/02/2009   K 3.7 01/02/2009   CL 104 01/02/2009   CREATININE 1.1 01/02/2009   BUN 21 01/02/2009   CO2 28 01/02/2009      Assessment / Plan:      #1 dyspnea on exertion with atypical chest discomfort in the setting of positive stress test and now found to have 3 vessel coronary occlusive disease #2 hyperlipidemia untreated #3 hypertension #4 possible sleep apnea to be evaluated further #5 obesity  I have reviewed the films and patient's history with Dr. Herbie Baltimore and agree with his three-vessel coronary occlusive disease and coronary artery bypass grafting offers the best relief of symptoms and preservation of myocardial function the LAD is a complex lesion the right totally occluded with collateral filling from jeopardize circumflex system. His symptoms a month ago are likely related to the occlusion of the right coronary artery. I've discussed the risks and options with the patient and have recommended proceeding with coronary artery bypass grafting tentatively planned for Friday, December 28  The goals risks and alternatives of the planned surgical  procedure CABG have been discussed with the patient in detail. The risks of the procedure including death, infection, stroke, myocardial infarction, bleeding, blood transfusion have all been discussed specifically.  I have quoted Joel Walters a 2% of perioperative mortality and a complication rate as high as 15%. The patient's questions have been answered.Mandy Fitzwater is willing  to proceed with the planned procedure.    Delight Ovens MD  Beeper [phone] Office [phone] 02/02/2011 5:27 PM

## 2011-02-02 NOTE — Brief Op Note (Signed)
02/02/2011  1:39 PM  PATIENT:  Joel Walters  60 y.o. male  PRE-OPERATIVE DIAGNOSIS:  Chest pain  POST-OPERATIVE DIAGNOSIS:  Multivessel CAD 100%mRCA, 80% early mid LAD; Bifurcation Cx-OM1 80% & OM2 prox 90%; LPL-RPL collaterals fill RPL & RPDA EF 60-65%;  Inferobasal HK, normal LVEDP  Procedure(s): LEFT HEART CATHETERIZATION WITH CORONARY ANGIOGRAM INTRA CORONARY NITROGLYCERIN INJECTION  Surgeon(s): Marykay Lex  ANESTHESIA:   local and IV sedation; 6mg  Versed; 150 mcg Fentanyl; PO Valium 5mg   EBL:    <10 ml  LOCAL MEDICATIONS USED:  LIDOCAINE 3 ml   SPECIMEN:  No Specimen  TOURNIQUET:  TR Band: 14 ml air; 1318 hrs.  DICTATION: .Note written in EPIC  PLAN OF CARE: Will consult CVTS to see in Short stay  PATIENT DISPOSITION:  Short-stay; no d/c until sen by CT Sgx   Delay start of Pharmacological VTE agent (>24hrs) due to surgical blood loss or risk of bleeding:  Not Applicable

## 2011-02-02 NOTE — Op Note (Addendum)
[facility]     CARDIAC CATHETERIZATION REPORT  NAME: Joel Walters   MRN: [id number] DOB: 10-12-1950   ADMIT DATE:  02/02/2011  Performing Cardiologist: Marykay Lex MD, MS Primary Physician: Noralee Stain, MD Primary Cardiologist:  Marykay Lex, MD, MS  Procedures Performed:  Left Heart CatheterizIation via 5 Fr Right Radial Artery access  Left Ventriculography, LAO 11 ml/sec for 33 ml total contrast  Native Coronary Angiography  IC NTG Injection x 2: 200 mcg each  Indication(s): High Risk Treadmill Myoview   Dyspnea on exertion  Chest tightness, worse with exertion  Decreased exercise tolerance  History: 60 y.o. male with a PMH of HTN, HLD, and obesity who was seen by me at [facility] for evaluation of chest pressure and shortness of breath on exertion. He underwent TM Myoview Stress testing that demonstrated a large sized, moderate intensity perfusion defect in the basal to apical inferoseptal & inferior walls with moderate reversibility. A defect consistent with RCA or dominant LCx distribution ischemia, and was considered to be high risk, with cardiac catheterization advised.   Consent: The procedure with Risks/Benefits/Alternatives and Indications was reviewed with the patient (and family).  All questions were answered.    Risks / Complications include, but not limited to: Death, MI, CVA/TIA, VF/VT (with defibrillation), Bradycardia (need for temporary pacer placement), contrast induced nephropathy, bleeding / bruising / hematoma / pseudoaneurysm, vascular or coronary injury (with possible emergent CT or Vascular Surgery), adverse medication reactions, infection.    The patient (and family) voice understanding and agree to proceed.    Consent for signed by MD and patient with RN witness -- placed on chart.  Procedure: The patient was brought to the 2nd Floor [place] [facility] in the fasting state and prepped and draped in  the usual sterile fashion for Right groin or Radial access.  A modified Allen's test with plethysmography was performed on the right wrist demonstrating adequate Ulnar Artery collateral flow.    Sterile technique was used including antiseptics, cap, gloves, gown, hand hygiene, mask and sheet.  Skin prep: Chlorhexidine;  Time Out: Verified patient identification, verified procedure, site/side was marked, verified correct patient position, special equipment/implants available, medications/allergies/relevent history reviewed, required imaging and test results available.  Performed  The right wrist was anesthetized with 1% subcutaneous Lidocaine.  The right radial artery was accessed using the Seldinger Technique with placement of a 5 Fr Glide Sheath. The sheath was aspirated and flushed.  Then a total of 10 ml of standard Radial Artery Cocktail (see medications) was infused.  Radial Cocktail: 5 mg Verapamil, 400 mcg NTG, 2 ml 2% Lidocaine  A 5 Fr TIG 4.0 Catheter was advanced of over a Versicore  wire into the ascending Aorta.  The catheter was used to engage the Right Coronary Artery.  Multiple cineangiographic views of the right coronary artery system were performed -- with and without intracoronary nitroglycerin.  This catheter was then attempted to be redirected into the left coronary artery was unsuccessful. Was exchanged over a long exchange safety J-wire for a 5 [place] JL 3.5 catheter followed by a 5 [place] EBU 3.5 guiding catheter. Both were unsuccessful. Lastly this catheter was exchanged for a 5 [place] EBU Radial left catheter which was used to successfully engaged the Left Main Coronary Artery. Multiple cineangiographic views of the left coronary artery system(s) were performed. This catheter was then exchanged over the Long Exchange Safety J wire for an angled Pigtail catheter that was advanced across the  Aortic Valve.  LV hemodynamics were measured and Left Ventriculography was performed.  LV  hemodynamics were then re-sampled, and the catheter was pulled back across the Aortic Valve for measurement of "pull-back" gradient.  The catheter and the wire was removed completely out of the body.  The sheath was removed in the Cath Lab with a TR band placed at 14 ml Air at 1318 (time).  Reverse Allen's test revealed non-occlusive hemostasis.  The patient was transported to the PACU in stable condition.   The patient  was stable before, during and following the procedure.   Patient did tolerate procedure well. There were not complications. EBL: Less than 10 mL  Medications:  Premedication: 5 mg  Valium,   Sedation:  6 mg IV Versed, 150 mcg IV Fentanyl  Contrast:  170 mm Omnipaque  IV Heparin:  5500 units. Radial Cocktail: 5 mg Verapamil, 400 mcg NTG, 2 ml 2% Lidocaine  Hemodynamics:  Central Aortic Pressure / Mean Aortic Pressure: 117/73 mmHg; 93 mmHg  LV Pressure / LV End diastolic Pressure:  120/11 mmHg; 15 mmHg  Left Ventriculography:  EF:  60-65%  Wall Motion: Normal, no regional abnormalities.   Coronary Angiographic Data:  Likely Right Dominant  Left Main:  Large caliber vessel with a long truck; trifurcates into LAD, Ramus, and Circumflex  Left Anterior Descending (LAD):  Moderate to large caliber vessel reaches to and around the apex, has 2 main diagonal branches and septal perforators. There is a 30% stepped-down just prior to D1 followed by a progressively tapering to roughly 70-80% extending beyond D2. The ostia of both D1 and D2 appear involved with the lesion with estimated 70% stenoses ostially  Circumflex (LCx):  Large caliber vessel, bifurcates into a large OM 1 and atrioventricular groove branch. D1 this bifurcation there is a focal 70-80% lesion. The remainder the atrioventricular groove branch gives rise to OM 2 before coursing into a posterior lateral system that gives rise to left multiple left to right collaterals filling via the right posterior lateral  branches up to appear to be the bifurcation of the distal RCA retrograde and antegrade filling of the PDA.  1st obtuse marginal: Moderate caliber, Proximal 40-50% lesion.  2nd obtuse marginal: Small to moderate caliber, Mid 80 and 90% lesion. The distal flow this vessel does not cover a large distribution   posterior lateral branch:  Multiple left to right collaterals as noted above; diffuse luminal irregularities  Ramus Intermedius:  Moderate caliber vessel covers a large proximal OM distribution. Angiographically normal  Right Coronary Artery: Appear to be small to moderate caliber vessel that as a 99% stenosis just after giving rise to a bifurcating right ventricle marginal branch. From there there is trivial flow for a few more millimeters before being totally occluded. Intracoronary nitroglycerin glycerin did not improve distal flow. The proximal vessel likely smaller than originally what was due to what appears to be chronic or subacute occlusion.  posterior descending artery:  appears to be a small moderate caliber vessel filling antegrade via left-to-right right collaterals to the posterior lateral branch.  posterior lateral branch:   appears to be small moderate caliber vessel with retrograde filling from left to right collaterals.  Impression: 1.   Severe three-vessel disease: Proximal/early mid RCA likely chronically 100% occluded; proximal/early mid LAD 80% stenosis involving D1 and D2; mid Circumflex and focal 70% stenosis care bifurcation of OM1 and AV groove Circumflex. 2.  As her left ventricular EF with normal LVEDP 3.  Likely best  served with complete surgical revascularization.  Plan: 1.   Standard post-radial care. 2.  Will consult CT surgery for non-emergent CABG. I have reviewed the films with Dr. Tyrone Sage who agrees with the plan for CABG and will see the patient in short stay area prior to discharge. He will have some of the necessary pre-CABG studies performed while  here. He will need to return for preop blood work as his operation is scheduled for December 28.  3.  Continue aggressive cardiac management. I have added Crestor. Pending his blood pressures postoperatively would consider the addition of an ACE inhibitor to the beta blocker. 4.  I will follow up with him after his operation. He'll be discharged to home with standard post-radial care. I denies his wife that he should be refrain from strenuous exertion. If he were to have worsening symptoms he is to contact our office.  The case and results was discussed with the patient (and family). The case and results was not discussed with the patient's PCP. The case and results was discussed with the patient's Cardiologist.  Time Spend Directly with Patient:  45 minutes  HARDING,DAVID W, M.D., M.S. [facility] [address] [place], [place]  [address]  [phone]  02/02/2011 5:39 PM

## 2011-02-03 ENCOUNTER — Encounter (HOSPITAL_COMMUNITY): Payer: Self-pay | Admitting: Pharmacy Technician

## 2011-02-09 ENCOUNTER — Ambulatory Visit (HOSPITAL_COMMUNITY)
Admission: RE | Admit: 2011-02-09 | Discharge: 2011-02-09 | Disposition: A | Discharge status: Home / Self Care | Payer: PRIVATE HEALTH INSURANCE | Source: Ambulatory Visit | Attending: Cardiothoracic Surgery | Admitting: Cardiothoracic Surgery

## 2011-02-09 ENCOUNTER — Encounter (HOSPITAL_COMMUNITY): Payer: Self-pay

## 2011-02-09 ENCOUNTER — Other Ambulatory Visit: Payer: Self-pay

## 2011-02-09 ENCOUNTER — Encounter (HOSPITAL_COMMUNITY)
Admit: 2011-02-09 | Discharge: 2011-02-09 | Disposition: A | Discharge status: Home / Self Care | Payer: PRIVATE HEALTH INSURANCE | Attending: Cardiothoracic Surgery | Admitting: Cardiothoracic Surgery

## 2011-02-09 DIAGNOSIS — I2510 Atherosclerotic heart disease of native coronary artery without angina pectoris: Secondary | ICD-10-CM

## 2011-02-09 DIAGNOSIS — Z01810 Encounter for preprocedural cardiovascular examination: Secondary | ICD-10-CM

## 2011-02-09 DIAGNOSIS — R079 Chest pain, unspecified: Secondary | ICD-10-CM | POA: Insufficient documentation

## 2011-02-09 HISTORY — PX: DOPPLER ECHOCARDIOGRAPHY: SHX263

## 2011-02-09 HISTORY — DX: Gastro-esophageal reflux disease without esophagitis: K21.9

## 2011-02-09 HISTORY — DX: Gastrointestinal hemorrhage, unspecified: K92.2

## 2011-02-09 HISTORY — DX: Inflammatory disease of prostate, unspecified: N41.9

## 2011-02-09 LAB — HEMOGLOBIN A1C
Hgb A1c MFr Bld: 6.3 % — ABNORMAL HIGH (ref <5.7)
Mean Plasma Glucose: 134 mg/dL — ABNORMAL HIGH (ref <117)

## 2011-02-09 LAB — BLOOD GAS, ARTERIAL
Acid-Base Excess: 2 mmol/L (ref 0.0–2.0)
Bicarbonate: 25.4 mEq/L — ABNORMAL HIGH (ref 20.0–24.0)
O2 Saturation: 97 %
Patient temperature: 98.6
TCO2: 26.5 mmol/L (ref 0–100)
pCO2 arterial: 35.9 mmHg (ref 35.0–45.0)
pH, Arterial: 7.464 — ABNORMAL HIGH (ref 7.350–7.450)
pO2, Arterial: 83.3 mmHg (ref 80.0–100.0)

## 2011-02-09 LAB — TYPE AND SCREEN
ABO/RH(D): O POS
Antibody Screen: NEGATIVE

## 2011-02-09 LAB — ABO/RH: ABO/RH(D): O POS

## 2011-02-09 LAB — COMPREHENSIVE METABOLIC PANEL
ALT: 27 U/L (ref 0–53)
AST: 19 U/L (ref 0–37)
Albumin: 4 g/dL (ref 3.5–5.2)
Alkaline Phosphatase: 100 U/L (ref 39–117)
BUN: 15 mg/dL (ref 6–23)
CO2: 32 mEq/L (ref 19–32)
Calcium: 9.9 mg/dL (ref 8.4–10.5)
Chloride: 98 mEq/L (ref 96–112)
Creatinine, Ser: 1.04 mg/dL (ref 0.50–1.35)
GFR calc Af Amer: 90 mL/min — ABNORMAL LOW (ref >90)
GFR calc non Af Amer: 77 mL/min — ABNORMAL LOW (ref >90)
Glucose, Bld: 111 mg/dL — ABNORMAL HIGH (ref 70–99)
Potassium: 4.2 mEq/L (ref 3.5–5.1)
Sodium: 138 mEq/L (ref 135–145)
Total Bilirubin: 0.7 mg/dL (ref 0.3–1.2)
Total Protein: 7.4 g/dL (ref 6.0–8.3)

## 2011-02-09 LAB — CBC
HCT: 47.2 % (ref 39.0–52.0)
Hemoglobin: 16.7 g/dL (ref 13.0–17.0)
MCH: 30.5 pg (ref 26.0–34.0)
MCHC: 35.4 g/dL (ref 30.0–36.0)
MCV: 86.3 fL (ref 78.0–100.0)
Platelets: 219 10*3/uL (ref 150–400)
RBC: 5.47 MIL/uL (ref 4.22–5.81)
RDW: 12.3 % (ref 11.5–15.5)
WBC: 8.2 10*3/uL (ref 4.0–10.5)

## 2011-02-09 LAB — PROTIME-INR
INR: 1 (ref 0.00–1.49)
Prothrombin Time: 13.4 seconds (ref 11.6–15.2)

## 2011-02-09 LAB — URINALYSIS, ROUTINE W REFLEX MICROSCOPIC
Bilirubin Urine: NEGATIVE
Glucose, UA: NEGATIVE mg/dL
Hgb urine dipstick: NEGATIVE
Ketones, ur: NEGATIVE mg/dL
Leukocytes, UA: NEGATIVE
Nitrite: NEGATIVE
Protein, ur: NEGATIVE mg/dL
Specific Gravity, Urine: 1.023 (ref 1.005–1.030)
Urobilinogen, UA: 0.2 mg/dL (ref 0.0–1.0)
pH: 6 (ref 5.0–8.0)

## 2011-02-09 LAB — SURGICAL PCR SCREEN
MRSA, PCR: NEGATIVE
Staphylococcus aureus: NEGATIVE

## 2011-02-09 LAB — APTT: aPTT: 31 seconds (ref 24–37)

## 2011-02-09 NOTE — Pre-Procedure Instructions (Signed)
20 ABBOTT JASINSKI  02/09/2011   Your procedure is scheduled on:  Friday, Dec 28  Report to [facility] Short Stay Center at *0530** AM.  Call this number if you have problems the morning of surgery: [phone]   Remember:   Do not eat food:After Midnight.  May have clear liquids: up to 4 Hours before arrival.  Clear liquids include soda, tea, black coffee, apple or grape juice, broth.  Take these medicines the morning of surgery with A SIP OF WATER: *Metaprolol**   Do not wear jewelry, make-up or nail polish.  Do not wear lotions, powders, or perfumes. You may wear deodorant.  Do not shave 48 hours prior to surgery.  Do not bring valuables to the hospital.  Contacts, dentures or bridgework may not be worn into surgery.  Leave suitcase in the car. After surgery it may be brought to your room.  For patients admitted to the hospital, checkout time is 11:00 AM the day of discharge.   Patients discharged the day of surgery will not be allowed to drive home.  Name and phone number of your driver: *n/a**  Special Instructions: CHG Shower Use Special Wash: 1/2 bottle night before surgery and 1/2 bottle morning of surgery.   Please read over the following fact sheets that you were given: Pain Booklet, Blood Transfusion Information, Open Heart Packet, MRSA Information and Surgical Site Infection Prevention

## 2011-02-10 MED ORDER — NITROGLYCERIN IN D5W 200-5 MCG/ML-% IV SOLN
2.0000 ug/min | INTRAVENOUS | Status: DC
  Filled 2011-02-10: qty 250

## 2011-02-10 MED ORDER — POTASSIUM CHLORIDE 2 MEQ/ML IV SOLN
80.0000 meq | INTRAVENOUS | Status: DC
  Filled 2011-02-10: qty 40

## 2011-02-10 MED ORDER — SODIUM CHLORIDE 0.9 % IV SOLN
INTRAVENOUS | Status: DC
  Filled 2011-02-10: qty 1

## 2011-02-10 MED ORDER — PLASMA-LYTE 148 IV SOLN
INTRAVENOUS | Status: AC
  Administered 2011-02-11: 09:00:00
  Filled 2011-02-10: qty 0.5

## 2011-02-10 MED ORDER — DEXTROSE 5 % IV SOLN
750.0000 mg | INTRAVENOUS | Status: DC
  Filled 2011-02-10: qty 750

## 2011-02-10 MED ORDER — SODIUM CHLORIDE 0.9 % IV SOLN
0.1000 ug/kg/h | INTRAVENOUS | Status: DC
  Filled 2011-02-10: qty 4

## 2011-02-10 MED ORDER — EPINEPHRINE HCL 1 MG/ML IJ SOLN
0.5000 ug/min | INTRAVENOUS | Status: DC
  Filled 2011-02-10: qty 4

## 2011-02-10 MED ORDER — PHENYLEPHRINE HCL 10 MG/ML IJ SOLN
30.0000 ug/min | INTRAVENOUS | Status: AC
  Administered 2011-02-11: 10 ug/min via INTRAVENOUS
  Filled 2011-02-10: qty 2

## 2011-02-10 MED ORDER — MAGNESIUM SULFATE 50 % IJ SOLN
40.0000 meq | INTRAMUSCULAR | Status: DC
  Filled 2011-02-10: qty 10

## 2011-02-10 MED ORDER — METOPROLOL TARTRATE 12.5 MG HALF TABLET: 12.5000 mg | ORAL_TABLET | Freq: Once | ORAL | Status: DC

## 2011-02-10 MED ORDER — AMINOCAPROIC ACID 250 MG/ML IV SOLN
INTRAVENOUS | Status: DC
  Filled 2011-02-10: qty 40

## 2011-02-10 MED ORDER — DEXTROSE 5 % IV SOLN
1.5000 g | INTRAVENOUS | Status: AC
  Administered 2011-02-11: 1.5 g via INTRAVENOUS
  Administered 2011-02-11: .75 g via INTRAVENOUS
  Filled 2011-02-10 (×2): qty 1.5

## 2011-02-10 MED ORDER — DOPAMINE-DEXTROSE 3.2-5 MG/ML-% IV SOLN
2.0000 ug/kg/min | INTRAVENOUS | Status: DC
  Filled 2011-02-10: qty 250

## 2011-02-10 MED ORDER — VANCOMYCIN HCL 1000 MG IV SOLR
1250.0000 mg | INTRAVENOUS | Status: AC
  Administered 2011-02-11: 1250 mg via INTRAVENOUS
  Filled 2011-02-10 (×2): qty 1250

## 2011-02-11 ENCOUNTER — Encounter (HOSPITAL_COMMUNITY): Payer: Self-pay

## 2011-02-11 ENCOUNTER — Encounter (HOSPITAL_COMMUNITY): Payer: Self-pay | Admitting: Certified Registered"

## 2011-02-11 ENCOUNTER — Inpatient Hospital Stay (HOSPITAL_COMMUNITY): Payer: PRIVATE HEALTH INSURANCE

## 2011-02-11 ENCOUNTER — Inpatient Hospital Stay (HOSPITAL_COMMUNITY)
Admission: RE | Admit: 2011-02-11 | Discharge: 2011-02-16 | DRG: 236 | Disposition: A | Discharge status: Home / Self Care | Payer: PRIVATE HEALTH INSURANCE | Source: Ambulatory Visit | Attending: Cardiothoracic Surgery | Admitting: Cardiothoracic Surgery

## 2011-02-11 ENCOUNTER — Encounter (HOSPITAL_COMMUNITY)
Admission: RE | Disposition: A | Discharge status: Home / Self Care | Payer: Self-pay | Source: Ambulatory Visit | Attending: Cardiothoracic Surgery

## 2011-02-11 ENCOUNTER — Ambulatory Visit (HOSPITAL_COMMUNITY): Payer: PRIVATE HEALTH INSURANCE | Admitting: Certified Registered"

## 2011-02-11 DIAGNOSIS — R7309 Other abnormal glucose: Secondary | ICD-10-CM | POA: Diagnosis present

## 2011-02-11 DIAGNOSIS — K219 Gastro-esophageal reflux disease without esophagitis: Secondary | ICD-10-CM | POA: Diagnosis present

## 2011-02-11 DIAGNOSIS — I2510 Atherosclerotic heart disease of native coronary artery without angina pectoris: Principal | ICD-10-CM | POA: Diagnosis not present

## 2011-02-11 DIAGNOSIS — E669 Obesity, unspecified: Secondary | ICD-10-CM | POA: Diagnosis present

## 2011-02-11 DIAGNOSIS — E785 Hyperlipidemia, unspecified: Secondary | ICD-10-CM | POA: Diagnosis present

## 2011-02-11 DIAGNOSIS — K5900 Constipation, unspecified: Secondary | ICD-10-CM | POA: Diagnosis not present

## 2011-02-11 DIAGNOSIS — Z951 Presence of aortocoronary bypass graft: Secondary | ICD-10-CM

## 2011-02-11 DIAGNOSIS — I10 Essential (primary) hypertension: Secondary | ICD-10-CM | POA: Diagnosis present

## 2011-02-11 DIAGNOSIS — D72829 Elevated white blood cell count, unspecified: Secondary | ICD-10-CM | POA: Diagnosis not present

## 2011-02-11 DIAGNOSIS — D62 Acute posthemorrhagic anemia: Secondary | ICD-10-CM | POA: Diagnosis not present

## 2011-02-11 DIAGNOSIS — I498 Other specified cardiac arrhythmias: Secondary | ICD-10-CM | POA: Diagnosis not present

## 2011-02-11 HISTORY — PX: OTHER SURGICAL HISTORY: SHX169

## 2011-02-11 HISTORY — PX: CORONARY ARTERY BYPASS GRAFT: SHX141

## 2011-02-11 LAB — POCT I-STAT 3, ART BLOOD GAS (G3+)
Acid-Base Excess: 1 mmol/L (ref 0.0–2.0)
Acid-base deficit: 2 mmol/L (ref 0.0–2.0)
Acid-base deficit: 1 mmol/L (ref 0.0–2.0)
Acid-base deficit: 1 mmol/L (ref 0.0–2.0)
Bicarbonate: 26.2 mEq/L — ABNORMAL HIGH (ref 20.0–24.0)
Bicarbonate: 22.4 mEq/L (ref 20.0–24.0)
Bicarbonate: 23.8 mEq/L (ref 20.0–24.0)
Bicarbonate: 22.9 mEq/L (ref 20.0–24.0)
O2 Saturation: 100 %
O2 Saturation: 94 %
O2 Saturation: 95 %
O2 Saturation: 99 %
Patient temperature: 35.9
Patient temperature: 36.7
TCO2: 28 mmol/L (ref 0–100)
TCO2: 23 mmol/L (ref 0–100)
TCO2: 25 mmol/L (ref 0–100)
TCO2: 24 mmol/L (ref 0–100)
pCO2 arterial: 44.8 mmHg (ref 35.0–45.0)
pCO2 arterial: 36.1 mmHg (ref 35.0–45.0)
pCO2 arterial: 39.2 mmHg (ref 35.0–45.0)
pCO2 arterial: 36.3 mmHg (ref 35.0–45.0)
pH, Arterial: 7.375 (ref 7.350–7.450)
pH, Arterial: 7.4 (ref 7.350–7.450)
pH, Arterial: 7.387 (ref 7.350–7.450)
pH, Arterial: 7.407 (ref 7.350–7.450)
pO2, Arterial: 245 mmHg — ABNORMAL HIGH (ref 80.0–100.0)
pO2, Arterial: 70 mmHg — ABNORMAL LOW (ref 80.0–100.0)
pO2, Arterial: 71 mmHg — ABNORMAL LOW (ref 80.0–100.0)
pO2, Arterial: 124 mmHg — ABNORMAL HIGH (ref 80.0–100.0)

## 2011-02-11 LAB — CBC
HCT: 37.4 % — ABNORMAL LOW (ref 39.0–52.0)
Hemoglobin: 13.5 g/dL (ref 13.0–17.0)
Hemoglobin: 13.1 g/dL (ref 13.0–17.0)
MCH: 30.3 pg (ref 26.0–34.0)
MCHC: 35.7 g/dL (ref 30.0–36.0)
MCHC: 35 g/dL (ref 30.0–36.0)
MCV: 86.4 fL (ref 78.0–100.0)
Platelets: 158 10*3/uL (ref 150–400)
RBC: 4.33 MIL/uL (ref 4.22–5.81)
RDW: 12.4 % (ref 11.5–15.5)
RDW: 12.6 % (ref 11.5–15.5)
WBC: 13.4 10*3/uL — ABNORMAL HIGH (ref 4.0–10.5)

## 2011-02-11 LAB — POCT I-STAT 4, (NA,K, GLUC, HGB,HCT)
Glucose, Bld: 142 mg/dL — ABNORMAL HIGH (ref 70–99)
Glucose, Bld: 132 mg/dL — ABNORMAL HIGH (ref 70–99)
Glucose, Bld: 109 mg/dL — ABNORMAL HIGH (ref 70–99)
Glucose, Bld: 124 mg/dL — ABNORMAL HIGH (ref 70–99)
Glucose, Bld: 156 mg/dL — ABNORMAL HIGH (ref 70–99)
Glucose, Bld: 138 mg/dL — ABNORMAL HIGH (ref 70–99)
HCT: 42 % (ref 39.0–52.0)
HCT: 39 % (ref 39.0–52.0)
HCT: 31 % — ABNORMAL LOW (ref 39.0–52.0)
HCT: 34 % — ABNORMAL LOW (ref 39.0–52.0)
HCT: 33 % — ABNORMAL LOW (ref 39.0–52.0)
HCT: 39 % (ref 39.0–52.0)
Hemoglobin: 14.3 g/dL (ref 13.0–17.0)
Hemoglobin: 13.3 g/dL (ref 13.0–17.0)
Hemoglobin: 10.5 g/dL — ABNORMAL LOW (ref 13.0–17.0)
Hemoglobin: 11.6 g/dL — ABNORMAL LOW (ref 13.0–17.0)
Hemoglobin: 11.2 g/dL — ABNORMAL LOW (ref 13.0–17.0)
Hemoglobin: 13.3 g/dL (ref 13.0–17.0)
Potassium: 3.8 mEq/L (ref 3.5–5.1)
Potassium: 4.3 mEq/L (ref 3.5–5.1)
Potassium: 4.1 mEq/L (ref 3.5–5.1)
Potassium: 4.8 mEq/L (ref 3.5–5.1)
Potassium: 3.7 mEq/L (ref 3.5–5.1)
Potassium: 3.5 mEq/L (ref 3.5–5.1)
Sodium: 139 mEq/L (ref 135–145)
Sodium: 138 mEq/L (ref 135–145)
Sodium: 134 mEq/L — ABNORMAL LOW (ref 135–145)
Sodium: 138 mEq/L (ref 135–145)
Sodium: 137 mEq/L (ref 135–145)
Sodium: 140 mEq/L (ref 135–145)

## 2011-02-11 LAB — POCT I-STAT, CHEM 8
BUN: 15 mg/dL (ref 6–23)
Calcium, Ion: 1.25 mmol/L (ref 1.12–1.32)
Chloride: 107 mEq/L (ref 96–112)
Creatinine, Ser: 0.9 mg/dL (ref 0.50–1.35)
Glucose, Bld: 113 mg/dL — ABNORMAL HIGH (ref 70–99)
HCT: 36 % — ABNORMAL LOW (ref 39.0–52.0)
Hemoglobin: 12.2 g/dL — ABNORMAL LOW (ref 13.0–17.0)
Potassium: 4.2 mEq/L (ref 3.5–5.1)
Sodium: 141 mEq/L (ref 135–145)
TCO2: 24 mmol/L (ref 0–100)

## 2011-02-11 LAB — GLUCOSE, CAPILLARY
Glucose-Capillary: 129 mg/dL — ABNORMAL HIGH (ref 70–99)
Glucose-Capillary: 117 mg/dL — ABNORMAL HIGH (ref 70–99)
Glucose-Capillary: 108 mg/dL — ABNORMAL HIGH (ref 70–99)
Glucose-Capillary: 117 mg/dL — ABNORMAL HIGH (ref 70–99)
Glucose-Capillary: 102 mg/dL — ABNORMAL HIGH (ref 70–99)
Glucose-Capillary: 110 mg/dL — ABNORMAL HIGH (ref 70–99)

## 2011-02-11 LAB — CREATININE, SERUM
Creatinine, Ser: 0.89 mg/dL (ref 0.50–1.35)
GFR calc Af Amer: >90 mL/min (ref >90)
GFR calc non Af Amer: >90 mL/min (ref >90)

## 2011-02-11 LAB — HEMOGLOBIN AND HEMATOCRIT, BLOOD
HCT: 30.9 % — ABNORMAL LOW (ref 39.0–52.0)
Hemoglobin: 11 g/dL — ABNORMAL LOW (ref 13.0–17.0)

## 2011-02-11 LAB — MAGNESIUM: Magnesium: 3 mg/dL — ABNORMAL HIGH (ref 1.5–2.5)

## 2011-02-11 LAB — PLATELET COUNT: Platelets: 177 10*3/uL (ref 150–400)

## 2011-02-11 LAB — PROTIME-INR
INR: 1.34 (ref 0.00–1.49)
Prothrombin Time: 16.8 seconds — ABNORMAL HIGH (ref 11.6–15.2)

## 2011-02-11 SURGERY — CORONARY ARTERY BYPASS GRAFTING (CABG)
Anesthesia: General | Site: Chest | Wound class: Clean

## 2011-02-11 MED ORDER — 0.9 % SODIUM CHLORIDE (POUR BTL) OPTIME
TOPICAL | Status: DC | PRN
  Administered 2011-02-11: 1000 mL

## 2011-02-11 MED ORDER — PROTAMINE SULFATE 10 MG/ML IV SOLN
INTRAVENOUS | Status: DC | PRN
  Administered 2011-02-11: 300 mg via INTRAVENOUS

## 2011-02-11 MED ORDER — HEPARIN SODIUM (PORCINE) 1000 UNIT/ML IJ SOLN
INTRAMUSCULAR | Status: DC | PRN
  Administered 2011-02-11: 35000 [IU] via INTRAVENOUS

## 2011-02-11 MED ORDER — VITAMIN C 500 MG PO TABS
500.0000 mg | ORAL_TABLET | Freq: Every day | ORAL | Status: DC
  Administered 2011-02-12: 500 mg via ORAL
  Filled 2011-02-11 (×2): qty 1

## 2011-02-11 MED ORDER — SODIUM CHLORIDE 0.9 % IJ SOLN
3.0000 mL | Freq: Two times a day (BID) | INTRAMUSCULAR | Status: DC
  Administered 2011-02-12 (×2): 3 mL via INTRAVENOUS

## 2011-02-11 MED ORDER — METOPROLOL TARTRATE 1 MG/ML IV SOLN: 2.5000 mg | INTRAVENOUS | Status: DC | PRN

## 2011-02-11 MED ORDER — BISACODYL 5 MG PO TBEC
10.0000 mg | DELAYED_RELEASE_TABLET | Freq: Every day | ORAL | Status: DC
  Administered 2011-02-12: 10 mg via ORAL
  Filled 2011-02-11: qty 2

## 2011-02-11 MED ORDER — THERA M PLUS PO TABS
1.0000 | ORAL_TABLET | Freq: Every day | ORAL | Status: DC
  Administered 2011-02-12: 1 via ORAL
  Filled 2011-02-11 (×2): qty 1

## 2011-02-11 MED ORDER — INSULIN REGULAR HUMAN 100 UNIT/ML IJ SOLN
INTRAMUSCULAR | Status: DC
  Filled 2011-02-11: qty 1

## 2011-02-11 MED ORDER — MIDAZOLAM HCL 2 MG/2ML IJ SOLN: 2.0000 mg | INTRAMUSCULAR | Status: DC | PRN

## 2011-02-11 MED ORDER — SODIUM CHLORIDE 0.9 % IJ SOLN
OROMUCOSAL | Status: DC | PRN
  Administered 2011-02-11: 09:00:00 via TOPICAL

## 2011-02-11 MED ORDER — DOCUSATE SODIUM 100 MG PO CAPS
200.0000 mg | ORAL_CAPSULE | Freq: Every day | ORAL | Status: DC
  Administered 2011-02-12: 200 mg via ORAL
  Filled 2011-02-11: qty 2

## 2011-02-11 MED ORDER — ASPIRIN 81 MG PO CHEW: 324.0000 mg | CHEWABLE_TABLET | Freq: Every day | ORAL | Status: DC

## 2011-02-11 MED ORDER — DEXTROSE 5 % IV SOLN
1.5000 g | Freq: Two times a day (BID) | INTRAVENOUS | Status: DC
  Administered 2011-02-12: 1.5 g via INTRAVENOUS
  Filled 2011-02-11 (×3): qty 1.5

## 2011-02-11 MED ORDER — METOPROLOL TARTRATE 25 MG/10 ML ORAL SUSPENSION
12.5000 mg | Freq: Two times a day (BID) | ORAL | Status: DC
  Filled 2011-02-11 (×5): qty 5

## 2011-02-11 MED ORDER — PANTOPRAZOLE SODIUM 40 MG PO TBEC: 40.0000 mg | DELAYED_RELEASE_TABLET | Freq: Every day | ORAL | Status: DC

## 2011-02-11 MED ORDER — BISACODYL 10 MG RE SUPP: 10.0000 mg | Freq: Every day | RECTAL | Status: DC

## 2011-02-11 MED ORDER — PHENYLEPHRINE HCL 10 MG/ML IJ SOLN
0.0000 ug/min | INTRAVENOUS | Status: DC
  Filled 2011-02-11: qty 2

## 2011-02-11 MED ORDER — CHLORHEXIDINE GLUCONATE 4 % EX LIQD: 30.0000 mL | CUTANEOUS | Status: DC

## 2011-02-11 MED ORDER — HEMOSTATIC AGENTS (NO CHARGE) OPTIME
TOPICAL | Status: DC | PRN
  Administered 2011-02-11: 1 via TOPICAL

## 2011-02-11 MED ORDER — LACTATED RINGERS IV SOLN
INTRAVENOUS | Status: DC | PRN
  Administered 2011-02-11: 07:00:00 via INTRAVENOUS

## 2011-02-11 MED ORDER — ACETAMINOPHEN 650 MG RE SUPP
650.0000 mg | RECTAL | Status: AC
  Administered 2011-02-11: 650 mg via RECTAL

## 2011-02-11 MED ORDER — NITROGLYCERIN IN D5W 200-5 MCG/ML-% IV SOLN
INTRAVENOUS | Status: DC | PRN
  Administered 2011-02-11: 5 ug/min via INTRAVENOUS

## 2011-02-11 MED ORDER — ACETAMINOPHEN 160 MG/5ML PO SOLN
975.0000 mg | Freq: Four times a day (QID) | ORAL | Status: DC
  Filled 2011-02-11: qty 40.6

## 2011-02-11 MED ORDER — MORPHINE SULFATE 4 MG/ML IJ SOLN
2.0000 mg | INTRAMUSCULAR | Status: DC | PRN
  Administered 2011-02-11: 2 mg via INTRAVENOUS
  Administered 2011-02-12 – 2011-02-13 (×8): 4 mg via INTRAVENOUS
  Filled 2011-02-11 (×9): qty 1

## 2011-02-11 MED ORDER — SODIUM CHLORIDE 0.9 % IV SOLN
5.0000 g | INTRAVENOUS | Status: DC
  Filled 2011-02-11: qty 20

## 2011-02-11 MED ORDER — VECURONIUM BROMIDE 10 MG IV SOLR
INTRAVENOUS | Status: DC | PRN
  Administered 2011-02-11: 5 mg via INTRAVENOUS

## 2011-02-11 MED ORDER — SODIUM CHLORIDE 0.9 % IV SOLN: 250.0000 mL | INTRAVENOUS | Status: DC

## 2011-02-11 MED ORDER — SODIUM CHLORIDE 0.9 % IV SOLN
100.0000 [IU] | INTRAVENOUS | Status: DC | PRN
  Administered 2011-02-11: 2.6 [IU]/h via INTRAVENOUS

## 2011-02-11 MED ORDER — LACTATED RINGERS IV SOLN
INTRAVENOUS | Status: DC
  Administered 2011-02-11 (×2): via INTRAVENOUS

## 2011-02-11 MED ORDER — AMINOCAPROIC ACID 250 MG/ML IV SOLN
10.0000 g | INTRAVENOUS | Status: DC | PRN
  Administered 2011-02-11: 5 g/h via INTRAVENOUS

## 2011-02-11 MED ORDER — DIPHENHYDRAMINE HCL 50 MG/ML IJ SOLN
INTRAMUSCULAR | Status: DC | PRN
  Administered 2011-02-11: 50 mg via INTRAVENOUS

## 2011-02-11 MED ORDER — PAPAVERINE HCL 30 MG/ML IJ SOLN
INTRAMUSCULAR | Status: DC | PRN
  Administered 2011-02-11: 60 mg via INTRAVENOUS

## 2011-02-11 MED ORDER — NITROGLYCERIN IN D5W 200-5 MCG/ML-% IV SOLN: 0.0000 ug/min | INTRAVENOUS | Status: DC

## 2011-02-11 MED ORDER — MAGNESIUM SULFATE 40 MG/ML IJ SOLN
4.0000 g | Freq: Once | INTRAMUSCULAR | Status: AC
  Administered 2011-02-11: 4 g via INTRAVENOUS
  Filled 2011-02-11: qty 100

## 2011-02-11 MED ORDER — MIDAZOLAM HCL 5 MG/5ML IJ SOLN
INTRAMUSCULAR | Status: DC | PRN
  Administered 2011-02-11: 1 mg via INTRAVENOUS
  Administered 2011-02-11: 4 mg via INTRAVENOUS
  Administered 2011-02-11: 2 mg via INTRAVENOUS

## 2011-02-11 MED ORDER — DOPAMINE-DEXTROSE 3.2-5 MG/ML-% IV SOLN
INTRAVENOUS | Status: DC | PRN
  Administered 2011-02-11: 3 ug/kg/min via INTRAVENOUS

## 2011-02-11 MED ORDER — TRAMADOL HCL 50 MG PO TABS
50.0000 mg | ORAL_TABLET | ORAL | Status: DC | PRN
  Administered 2011-02-12 – 2011-02-13 (×3): 100 mg via ORAL
  Filled 2011-02-11 (×3): qty 2

## 2011-02-11 MED ORDER — ALBUMIN HUMAN 5 % IV SOLN
250.0000 mL | INTRAVENOUS | Status: AC | PRN
  Administered 2011-02-11 (×2): 250 mL via INTRAVENOUS

## 2011-02-11 MED ORDER — LIDOCAINE HCL (CARDIAC) 20 MG/ML IV SOLN
INTRAVENOUS | Status: DC | PRN
  Administered 2011-02-11: 250 mg via INTRAVENOUS
  Administered 2011-02-11: 150 mg via INTRAVENOUS
  Administered 2011-02-11: 100 mg via INTRAVENOUS

## 2011-02-11 MED ORDER — SODIUM CHLORIDE 0.9 % IV SOLN
200.0000 ug | INTRAVENOUS | Status: DC | PRN
  Administered 2011-02-11: 0.2 ug/kg/h via INTRAVENOUS

## 2011-02-11 MED ORDER — ASPIRIN EC 325 MG PO TBEC
325.0000 mg | DELAYED_RELEASE_TABLET | Freq: Every day | ORAL | Status: DC
  Administered 2011-02-12: 325 mg via ORAL
  Filled 2011-02-11 (×2): qty 1

## 2011-02-11 MED ORDER — FAMOTIDINE IN NACL 20-0.9 MG/50ML-% IV SOLN
20.0000 mg | Freq: Two times a day (BID) | INTRAVENOUS | Status: AC
  Administered 2011-02-11: 20 mg via INTRAVENOUS

## 2011-02-11 MED ORDER — SODIUM CHLORIDE 0.45 % IV SOLN
INTRAVENOUS | Status: DC
  Administered 2011-02-11: 14:00:00 via INTRAVENOUS

## 2011-02-11 MED ORDER — ACETAMINOPHEN 500 MG PO TABS
1000.0000 mg | ORAL_TABLET | Freq: Four times a day (QID) | ORAL | Status: DC
  Administered 2011-02-12 (×4): 1000 mg via ORAL
  Filled 2011-02-11 (×9): qty 2

## 2011-02-11 MED ORDER — ROSUVASTATIN CALCIUM 20 MG PO TABS
20.0000 mg | ORAL_TABLET | Freq: Every day | ORAL | Status: DC
  Administered 2011-02-12: 20 mg via ORAL
  Filled 2011-02-11 (×3): qty 1

## 2011-02-11 MED ORDER — ONDANSETRON HCL 4 MG/2ML IJ SOLN
4.0000 mg | Freq: Four times a day (QID) | INTRAMUSCULAR | Status: DC | PRN
  Administered 2011-02-12 – 2011-02-13 (×2): 4 mg via INTRAVENOUS
  Filled 2011-02-11 (×2): qty 2

## 2011-02-11 MED ORDER — ALBUMIN HUMAN 5 % IV SOLN
INTRAVENOUS | Status: DC | PRN
  Administered 2011-02-11 (×3): via INTRAVENOUS

## 2011-02-11 MED ORDER — METOPROLOL TARTRATE 12.5 MG HALF TABLET
12.5000 mg | ORAL_TABLET | Freq: Two times a day (BID) | ORAL | Status: DC
Start: 2011-02-11 — End: 2011-02-13
  Administered 2011-02-12 (×2): 12.5 mg via ORAL
  Filled 2011-02-11 (×5): qty 1

## 2011-02-11 MED ORDER — SODIUM CHLORIDE 0.9 % IJ SOLN: 3.0000 mL | INTRAMUSCULAR | Status: DC | PRN

## 2011-02-11 MED ORDER — MORPHINE SULFATE 2 MG/ML IJ SOLN
1.0000 mg | INTRAMUSCULAR | Status: AC | PRN
  Filled 2011-02-11: qty 1

## 2011-02-11 MED ORDER — SODIUM CHLORIDE 0.9 % IV SOLN
0.1000 ug/kg/h | INTRAVENOUS | Status: DC
  Administered 2011-02-11: 0.3 ug/kg/h via INTRAVENOUS
  Filled 2011-02-11 (×2): qty 2

## 2011-02-11 MED ORDER — LACTATED RINGERS IV SOLN: 500.0000 mL | Freq: Once | INTRAVENOUS | Status: AC | PRN

## 2011-02-11 MED ORDER — ROCURONIUM BROMIDE 100 MG/10ML IV SOLN
INTRAVENOUS | Status: DC | PRN
  Administered 2011-02-11 (×5): 50 mg via INTRAVENOUS

## 2011-02-11 MED ORDER — PROPOFOL 10 MG/ML IV EMUL
INTRAVENOUS | Status: DC | PRN
  Administered 2011-02-11: 60 mg via INTRAVENOUS

## 2011-02-11 MED ORDER — POTASSIUM CHLORIDE 10 MEQ/50ML IV SOLN
10.0000 meq | INTRAVENOUS | Status: AC
  Administered 2011-02-11 (×3): 10 meq via INTRAVENOUS

## 2011-02-11 MED ORDER — FENTANYL CITRATE 0.05 MG/ML IJ SOLN
INTRAMUSCULAR | Status: DC | PRN
  Administered 2011-02-11: 50 ug via INTRAVENOUS
  Administered 2011-02-11: 400 ug via INTRAVENOUS
  Administered 2011-02-11: 250 ug via INTRAVENOUS
  Administered 2011-02-11: 150 ug via INTRAVENOUS
  Administered 2011-02-11: 3 ug via INTRAVENOUS
  Administered 2011-02-11: 100 ug via INTRAVENOUS
  Administered 2011-02-11: 50 ug via INTRAVENOUS

## 2011-02-11 MED ORDER — VANCOMYCIN HCL 1000 MG IV SOLR
1000.0000 mg | Freq: Once | INTRAVENOUS | Status: AC
  Administered 2011-02-11: 1000 mg via INTRAVENOUS
  Filled 2011-02-11 (×2): qty 1000

## 2011-02-11 MED ORDER — SODIUM CHLORIDE 0.9 % IV SOLN: INTRAVENOUS | Status: DC

## 2011-02-11 MED ORDER — ACETAMINOPHEN 160 MG/5ML PO SOLN: 650.0000 mg | ORAL | Status: AC

## 2011-02-11 SURGICAL SUPPLY — 126 items
ATTRACTOMAT 16X20 MAGNETIC DRP (DRAPES) ×2 IMPLANT
BAG DECANTER FOR FLEXI CONT (MISCELLANEOUS) ×2 IMPLANT
BANDAGE ELASTIC 4 VELCRO ST LF (GAUZE/BANDAGES/DRESSINGS) ×2 IMPLANT
BANDAGE ELASTIC 6 VELCRO ST LF (GAUZE/BANDAGES/DRESSINGS) ×2 IMPLANT
BANDAGE GAUZE ELAST BULKY 4 IN (GAUZE/BANDAGES/DRESSINGS) ×2 IMPLANT
BLADE SAW STERNAL (BLADE) ×2 IMPLANT
BLADE SURG 11 STRL SS (BLADE) ×2 IMPLANT
BLADE SURG ROTATE 9660 (MISCELLANEOUS) ×2 IMPLANT
BNDG ELASTIC 2 VLCR STRL LF (GAUZE/BANDAGES/DRESSINGS) ×4 IMPLANT
CANISTER SUCTION 2500CC (MISCELLANEOUS) ×2 IMPLANT
CANN PRFSN .5XCNCT 15X34-48 (MISCELLANEOUS) ×1
CANNULA ARTERIAL NVNT 3/8 20FR (MISCELLANEOUS) ×2 IMPLANT
CANNULA ARTERIAL NVNT 3/8 22FR (MISCELLANEOUS) ×2 IMPLANT
CANNULA PRFSN .5XCNCT 15X34-48 (MISCELLANEOUS) ×1 IMPLANT
CANNULA VEN 2 STAGE (MISCELLANEOUS) ×1
CANNULA VESSEL W/WING WO/VALVE (CANNULA) ×2 IMPLANT
CATH CPB KIT GERHARDT (MISCELLANEOUS) ×2 IMPLANT
CATH ROBINSON RED A/P 18FR (CATHETERS) IMPLANT
CATH THORACIC 28FR (CATHETERS) ×2 IMPLANT
CATH THORACIC 28FR RT ANG (CATHETERS) IMPLANT
CATH THORACIC 36FR (CATHETERS) IMPLANT
CATH THORACIC 36FR RT ANG (CATHETERS) IMPLANT
CLIP FOGARTY SPRING 6M (CLIP) ×2 IMPLANT
CLIP TI MEDIUM 24 (CLIP) IMPLANT
CLIP TI WIDE RED SMALL 24 (CLIP) ×2 IMPLANT
CLOTH BEACON ORANGE TIMEOUT ST (SAFETY) ×2 IMPLANT
COVER SURGICAL LIGHT HANDLE (MISCELLANEOUS) ×4 IMPLANT
CRADLE DONUT ADULT HEAD (MISCELLANEOUS) ×2 IMPLANT
DERMABOND ADVANCED (GAUZE/BANDAGES/DRESSINGS) ×2
DERMABOND ADVANCED .7 DNX12 (GAUZE/BANDAGES/DRESSINGS) ×2 IMPLANT
DRAIN CHANNEL 28F RND 3/8 FF (WOUND CARE) ×2 IMPLANT
DRAIN CHANNEL 32F RND 10.7 FF (WOUND CARE) IMPLANT
DRAPE CARDIOVASCULAR INCISE (DRAPES) ×1
DRAPE SLUSH MACHINE 52X66 (DRAPES) ×2 IMPLANT
DRAPE SLUSH/WARMER DISC (DRAPES) IMPLANT
DRAPE SRG 135X102X78XABS (DRAPES) ×1 IMPLANT
DRSG COVADERM 4X14 (GAUZE/BANDAGES/DRESSINGS) ×2 IMPLANT
ELECT BLADE 4.0 EZ CLEAN MEGAD (MISCELLANEOUS) ×2
ELECT CAUTERY BLADE 6.4 (BLADE) ×2 IMPLANT
ELECT REM PT RETURN 9FT ADLT (ELECTROSURGICAL) ×4
ELECTRODE BLDE 4.0 EZ CLN MEGD (MISCELLANEOUS) ×1 IMPLANT
ELECTRODE REM PT RTRN 9FT ADLT (ELECTROSURGICAL) ×2 IMPLANT
GAUZE KERLIX 2  STERILE LF (GAUZE/BANDAGES/DRESSINGS) ×4 IMPLANT
GLOVE BIO SURGEON STRL SZ 6 (GLOVE) ×8 IMPLANT
GLOVE BIO SURGEON STRL SZ 6.5 (GLOVE) ×12 IMPLANT
GLOVE BIO SURGEON STRL SZ7 (GLOVE) IMPLANT
GLOVE BIO SURGEON STRL SZ7.5 (GLOVE) IMPLANT
GLOVE BIO SURGEON STRL SZ8 (GLOVE) ×2 IMPLANT
GLOVE BIOGEL PI IND STRL 6 (GLOVE) IMPLANT
GLOVE BIOGEL PI IND STRL 6.5 (GLOVE) IMPLANT
GLOVE BIOGEL PI IND STRL 7.0 (GLOVE) ×6 IMPLANT
GLOVE BIOGEL PI INDICATOR 6 (GLOVE)
GLOVE BIOGEL PI INDICATOR 6.5 (GLOVE)
GLOVE BIOGEL PI INDICATOR 7.0 (GLOVE) ×6
GLOVE EUDERMIC 7 POWDERFREE (GLOVE) IMPLANT
GLOVE EXAM NITRILE XS STR PU (GLOVE) ×4 IMPLANT
GLOVE ORTHO TXT STRL SZ7.5 (GLOVE) IMPLANT
GOWN STRL NON-REIN LRG LVL3 (GOWN DISPOSABLE) ×12 IMPLANT
HEMOSTAT POWDER SURGIFOAM 1G (HEMOSTASIS) ×6 IMPLANT
HEMOSTAT SURGICEL 2X14 (HEMOSTASIS) ×2 IMPLANT
INSERT FOGARTY 61MM (MISCELLANEOUS) IMPLANT
INSERT FOGARTY XLG (MISCELLANEOUS) IMPLANT
KIT BASIN OR (CUSTOM PROCEDURE TRAY) ×2 IMPLANT
KIT ROOM TURNOVER OR (KITS) ×2 IMPLANT
KIT SUCTION CATH 14FR (SUCTIONS) ×4 IMPLANT
KIT VASOVIEW W/TROCAR VH 2000 (KITS) ×2 IMPLANT
LEAD PACING MYOCARDI (MISCELLANEOUS) ×2 IMPLANT
MARKER GRAFT CORONARY BYPASS (MISCELLANEOUS) ×20 IMPLANT
NS IRRIG 1000ML POUR BTL (IV SOLUTION) ×10 IMPLANT
PACK OPEN HEART (CUSTOM PROCEDURE TRAY) ×2 IMPLANT
PAD ARMBOARD 7.5X6 YLW CONV (MISCELLANEOUS) ×4 IMPLANT
PENCIL BUTTON HOLSTER BLD 10FT (ELECTRODE) ×2 IMPLANT
PUNCH AORTIC ROTATE 4.0MM (MISCELLANEOUS) ×2 IMPLANT
PUNCH AORTIC ROTATE 4.5MM 8IN (MISCELLANEOUS) IMPLANT
PUNCH AORTIC ROTATE 5MM 8IN (MISCELLANEOUS) IMPLANT
SET CARDIOPLEGIA MPS 5001102 (MISCELLANEOUS) ×2 IMPLANT
SOLUTION ANTI FOG 6CC (MISCELLANEOUS) IMPLANT
SPONGE GAUZE 4X4 12PLY (GAUZE/BANDAGES/DRESSINGS) ×4 IMPLANT
SPONGE GAUZE 4X4 STERILE 39 (GAUZE/BANDAGES/DRESSINGS) ×6 IMPLANT
SPONGE INTESTINAL PEANUT (DISPOSABLE) IMPLANT
SPONGE LAP 18X18 X RAY DECT (DISPOSABLE) ×6 IMPLANT
SPONGE LAP 4X18 X RAY DECT (DISPOSABLE) IMPLANT
SUT BONE WAX W31G (SUTURE) IMPLANT
SUT MNCRL AB 4-0 PS2 18 (SUTURE) ×2 IMPLANT
SUT PROLENE 3 0 SH 1 (SUTURE) ×2 IMPLANT
SUT PROLENE 3 0 SH DA (SUTURE) IMPLANT
SUT PROLENE 3 0 SH1 36 (SUTURE) IMPLANT
SUT PROLENE 4 0 RB 1 (SUTURE)
SUT PROLENE 4 0 SH DA (SUTURE) IMPLANT
SUT PROLENE 4 0 TF (SUTURE) IMPLANT
SUT PROLENE 4-0 RB1 .5 CRCL 36 (SUTURE) IMPLANT
SUT PROLENE 5 0 C 1 36 (SUTURE) IMPLANT
SUT PROLENE 6 0 C 1 30 (SUTURE) IMPLANT
SUT PROLENE 6 0 CC (SUTURE) ×6 IMPLANT
SUT PROLENE 7 0 BV 1 (SUTURE) IMPLANT
SUT PROLENE 7 0 BV1 MDA (SUTURE) ×10 IMPLANT
SUT PROLENE 7.0 RB 3 (SUTURE) IMPLANT
SUT PROLENE 8 0 BV175 6 (SUTURE) ×6 IMPLANT
SUT SILK  1 MH (SUTURE)
SUT SILK 1 MH (SUTURE) IMPLANT
SUT SILK 2 0 SH CR/8 (SUTURE) IMPLANT
SUT SILK 3 0 SH CR/8 (SUTURE) IMPLANT
SUT STEEL 6MS V (SUTURE) IMPLANT
SUT STEEL STERNAL CCS#1 18IN (SUTURE) ×2 IMPLANT
SUT STEEL SZ 6 DBL 3X14 BALL (SUTURE) ×2 IMPLANT
SUT VIC AB 1 CTX 18 (SUTURE) ×4 IMPLANT
SUT VIC AB 1 CTX 36 (SUTURE)
SUT VIC AB 1 CTX36XBRD ANBCTR (SUTURE) IMPLANT
SUT VIC AB 2-0 CT1 27 (SUTURE) ×1
SUT VIC AB 2-0 CT1 TAPERPNT 27 (SUTURE) ×1 IMPLANT
SUT VIC AB 2-0 CTX 27 (SUTURE) IMPLANT
SUT VIC AB 3-0 SH 27 (SUTURE)
SUT VIC AB 3-0 SH 27X BRD (SUTURE) IMPLANT
SUT VIC AB 3-0 X1 27 (SUTURE) IMPLANT
SUT VICRYL 4-0 PS2 18IN ABS (SUTURE) IMPLANT
SUTURE E-PAK OPEN HEART (SUTURE) ×2 IMPLANT
SYSTEM SAHARA CHEST DRAIN ATS (WOUND CARE) ×2 IMPLANT
TAPE CLOTH SURG 4X10 WHT LF (GAUZE/BANDAGES/DRESSINGS) ×2 IMPLANT
TOWEL OR 17X24 6PK STRL BLUE (TOWEL DISPOSABLE) ×4 IMPLANT
TOWEL OR 17X26 10 PK STRL BLUE (TOWEL DISPOSABLE) ×4 IMPLANT
TRAY FOLEY IC TEMP SENS 14FR (CATHETERS) ×2 IMPLANT
TUBE FEEDING 8FR 16IN STR KANG (MISCELLANEOUS) ×2 IMPLANT
TUBE SUCT INTRACARD DLP 20F (MISCELLANEOUS) ×2 IMPLANT
TUBING INSUFFLATION 10FT LAP (TUBING) ×2 IMPLANT
UNDERPAD 30X30 INCONTINENT (UNDERPADS AND DIAPERS) ×2 IMPLANT
WATER STERILE IRR 1000ML POUR (IV SOLUTION) ×4 IMPLANT

## 2011-02-11 NOTE — Anesthesia Postprocedure Evaluation (Signed)
  Anesthesia Post-op Note  Patient: Joel Walters  Procedure(s) Performed:  CORONARY ARTERY BYPASS GRAFTING (CABG)  Patient Location: SICU  Anesthesia Type: General  Level of Consciousness: sedated  Airway and Oxygen Therapy: Patient remains intubated per anesthesia plan  Post-op Pain: Unable to assess. Pt sedated  Post-op Assessment: Post-op Vital signs reviewed, Patient's Cardiovascular Status Stable, Respiratory Function Stable, Patent Airway, No signs of Nausea or vomiting and Pain level controlled  Post-op Vital Signs: Reviewed and stable  Complications: No apparent anesthesia complications

## 2011-02-11 NOTE — Transfer of Care (Signed)
Immediate Anesthesia Transfer of Care Note  Patient: Joel Walters  Procedure(s) Performed:  CORONARY ARTERY BYPASS GRAFTING (CABG)  Patient Location: SICU  Anesthesia Type: General  Level of Consciousness: sedated  Airway & Oxygen Therapy: Patient remains intubated per anesthesia plan  Post-op Assessment: Post -op Vital signs reviewed and stable  Post vital signs: Reviewed and stable  Complications: No apparent anesthesia complications

## 2011-02-11 NOTE — Brief Op Note (Signed)
02/11/2011  12:09 PM  PATIENT:  Joel Walters  60 y.o. male  PRE-OPERATIVE DIAGNOSIS:  Coronary Artery Disease  POST-OPERATIVE DIAGNOSIS:  Coronary Artery Disease  PROCEDURE:  CORONARY ARTERY BYPASS GRAFTING (CABG)x5 (LIMA to distal LAD, SVG to Diagonal,SVG to OM,SVG sequentially to Acute Marginal and Posterior Descending Artery) with EVH from the right thigh and calf and EVH left thigh  SURGEON:  Delight Ovens, MD  PHYSICIAN ASSISTANT: Doree Fudge PA-C   ANESTHESIA:   general  EBL:  Total I/O In: -  Out: 125 [Urine:125]    COUNTS CORRECT:  YES   DICTATION: other dictated  PLAN OF CARE: Admit to inpatient   PATIENT DISPOSITION:  ICU - intubated and hemodynamically stable.   Delay start of Pharmacological VTE agent (>24hrs) due to surgical blood loss or risk of bleeding:  YES

## 2011-02-11 NOTE — Progress Notes (Signed)
Patient ID: Joel Walters, male   DOB: 1950/08/26, 60 y.o.   MRN: [id number] BP 98/67  Pulse 86  Temp(Src) 99.1 F (37.3 C) (Core (Comment))  Resp 17  Wt 241 lb (109.317 kg)  SpO2 97%  Extubated  Not bleeding Neuro intact  Delight Ovens MD  Beeper [phone] Office [phone] 02/11/2011 9:14 PM

## 2011-02-11 NOTE — Anesthesia Procedure Notes (Signed)
Procedure Name: Intubation Date/Time: 02/11/2011 7:37 AM Performed by: Rossie Muskrat Pre-anesthesia Checklist: Patient identified, Patient being monitored, Emergency Drugs available, Suction available and Timeout performed Patient Re-evaluated:Patient Re-evaluated prior to inductionOxygen Delivery Method: Circle System Utilized Preoxygenation: Pre-oxygenation with 100% oxygen Intubation Type: IV induction Ventilation: Mask ventilation without difficulty and Oral airway inserted - appropriate to patient size Laryngoscope Size: Miller and 2 Grade View: Grade I Tube type: Oral Tube size: 8.5 mm Number of attempts: 1 Airway Equipment and Method: stylet Placement Confirmation: ETT inserted through vocal cords under direct vision,  breath sounds checked- equal and bilateral and positive ETCO2 Secured at: 8.5 cm Tube secured with: Tape Dental Injury: Teeth and Oropharynx as per pre-operative assessment

## 2011-02-11 NOTE — Preoperative (Signed)
Beta Blockers   Reason not to administer Beta Blockers:Pt took metoprolol @ 0430

## 2011-02-11 NOTE — H&P (Signed)
[address]            [place] [address]          [phone]                         Joel Walters [facility] Medical Record #[id number] Date of Birth: 13-Apr-1950  Referring: Dr Bryan Lemma Primary Care Dr Stanford Scotland Archinal  Chief Complaint:   Shortness of breath  History of Present Illness:    The patient is a 60 year old male with a history of hypertension obesity and hyperlipidemia as well as possible sleep apnea. Approximate 6 weeks ago he saw his primary care doctor because of vague complaints including confusion dyspnea on exertion arm discomfort and excessive sweating. He's also had some episodes of lightheadedness. He notes that he's had increasing fatigue. He notes since the original onset of symptoms he actually feels better now. He denies any definite chest pain. He was referred to Dr. Windy Carina. Myoview stress test was performed leading to cardiac catheterization today. I've been asked to see him in consideration of coronary artery bypass grafting.  He's had no known previous history of coronary artery disease denies previous stroke denies diabetes denies smoking history does have a known history of hyperlipidemia currently untreated and a history of hypertension.   Patient returns today for CABG, he has not had chest pain since seen last week.   Current Activity/ Functional Status: Patient is independent with mobility/ambulation, transfers, ADL's, IADL's.   Past medical history Hypertension  hyperlipidemia Question of sleep apnea though this has not been fully evaluated   Past surgical history Sinus surgery in 2011  Family history: Patient notes that he is adopted and so does not know any details of family history he does have 3 children 1 who at age 1 has diabetes.  History   Social History  . Marital Status: Single dating long term partner   Occupational History   Patient works in Production designer, theatre/television/film and apartment complex     Social History Main Topics  . Smoking status:  nonsmoker   . Smokeless tobacco: Not on file  . Alcohol Use:  previously was in a rock band and notes heavy alcohol use but no alcohol use for the past 5 years   . Drug Use:  denies drug use            Allergies  Allergen Reactions  . Codeine     REACTION: Jittery, itches    Prescriptions prior to admission  Medication Sig Dispense Refill  . aspirin EC 81 MG tablet Take 81 mg by mouth daily.        . hydrochlorothiazide (HYDRODIURIL) 25 MG tablet Take 25 mg by mouth daily.       . metoprolol (TOPROL-XL) 50 MG 24 hr tablet Take 50 mg by mouth daily.       . Multiple Vitamins-Minerals (MULTIVITAMINS THER. W/MINERALS) TABS Take 1 tablet by mouth daily.       . rosuvastatin (CRESTOR) 20 MG tablet Take 1 tablet (20 mg total) by mouth daily at 6 PM.  30 tablet  3  . vitamin C (ASCORBIC ACID) 500 MG tablet Take 500 mg by mouth daily.            Review of Systems:     Cardiac Review of Systems: Y or N  Chest  Pain [  n  ]  Resting SOB [n   ] Exertional SOB  [ y ]  Joel Walters Joel.Walters  ]   Pedal Edema [ n  ]    Palpitations [n  ] Syncope  Joel.Walters  ]   Presyncope [name]   ]  General Review of Systems: [Y] = yes [  ]=no Constitional: recent weight change [ n ]; anorexia [ n ]; fatigue [name]  ]; nausea [ y ]; night sweats [name]  ]; fever [ nn ]; or chills [ n ];                                                                                                                                          Dental: poor dentition[n  ];  Eye : blurred vision [ n ]; diplopia [  n ]; vision changes [ n ];  Amaurosis fugax[ n]; Resp: cough [n  ];  wheezing[ n ];  hemoptysis[ n ]; shortness of breath[  n]; paroxysmal nocturnal dyspnea[ n ]; dyspnea on exertion[y  ]; or orthopnea[n  ];  GI:  gallstones[  ], vomiting[  ];  dysphagia[  ]; melena[  ];  hematochezia [  ]; heartburn[  ];   Hx of  Colonoscopy[ n ]; GU: kidney stones [  ]; hematuria[  ];   dysuria [  ];  nocturia[y   ];  history of     obstruction [n ];             Skin: rash, swelling[  ];, hair loss[  ];  peripheral edema[  ];  or itching[  ]; Musculosketetal: myalgias[  ];  joint swelling[  ];  joint erythema[  ];  joint pain[  ];  back pain[  ];  Heme/Lymph: bruising[  ];  bleeding[  ];  anemia[  ];  Neuro: TIA[n  ];  headaches[ n ];  stroke[n  ];  vertigo[  ];  seizures[  ];   paresthesias[y   Arm biltaerial];  difficulty walking[  n];  Psych:depression[ n ]; anxiety[ n ];  Endocrine: diabetes[ n ];  thyroid dysfunction[  n];  Immunizations: Flu [ n ]; Pneumococcal[ n ];  Other:  Physical Exam: BP 139/91  Pulse 79  Temp(Src) 98.3 F (36.8 C) (Oral)  Resp 18  Wt 241 lb (109.317 kg)  SpO2 95%  General appearance: alert, cooperative, appears older than stated age and no distress Neurologic: intact the patient has no carotid bruits Heart: regular rate and rhythm, S1, S2 normal, no murmur, click, rub or gallop and normal apical impulse Lungs: clear to auscultation bilaterally Abdomen: soft, non-tender; bowel sounds normal; no masses,  no organomegaly Extremities: extremities normal, atraumatic, no cyanosis or edema, Homans sign is negative, no sign of DVT, no edema, redness or tenderness in the calves or thighs and no ulcers, gangrene or trophic changes Wound: rt wrist  cath site without hematoma   Diagnostic Studies & Laboratory data:     Recent Radiology Findings:  Clinical Data: Precardiac catheterization.  CHEST - 2 VIEW  Comparison: None.  Findings: Midline trachea. Normal heart size and mediastinal  contours. No pleural effusion or pneumothorax. Left upper lobe  calcified granuloma. Right lung clear.  IMPRESSION:  No acute cardiopulmonary disease.  Original Report Authenticated By: Consuello Bossier, M.D.                  Myoview stress test was performed Dr. Elissa Hefty office Baseline EKG was normal stress EKG showed no changes stress images show a large moderate intensity  perfusion defect in the basal inferior septal basal inferior and mid inferior septal mid inferior and apical inferior walls resting images reveal moderate defect reversibility ejection fraction is measured at 54%  02/02/2011  1:39 PM  PATIENT: Joel Walters 60 y.o. male  PRE-OPERATIVE DIAGNOSIS: Chest pain  POST-OPERATIVE DIAGNOSIS: Multivessel CAD  100%mRCA, 80% early mid LAD; Bifurcation Cx-OM1 80% & OM2 prox 90%; LPL-RPL collaterals fill RPL & RPDA  EF 60-65%; Inferobasal HK, normal LVEDP  Procedure(s):  LEFT HEART CATHETERIZATION WITH CORONARY ANGIOGRAM  INTRA CORONARY NITROGLYCERIN INJECTION  Surgeon(s):  Marykay Lex  ANESTHESIA: local and IV sedation; 6mg  Versed; 150 mcg Fentanyl; PO Valium 5mg   EBL: <10 ml  LOCAL MEDICATIONS USED: LIDOCAINE 3 ml  SPECIMEN: No Specimen  TOURNIQUET: TR Band: 14 ml air; 1318 hrs.  DICTATION: .Note written in EPIC  PLAN OF CARE: Will consult CVTS to see in Short stay  PATIENT DISPOSITION: Short-stay; no d/c until sen by CT Sgx  Delay start of Pharmacological VTE agent (>24hrs) due to surgical blood loss or risk of bleeding: Not Applicable       Recent Lab Findings: Lab Results  Component Value Date   WBC 8.2 02/09/2011   HGB 16.7 02/09/2011   HCT 47.2 02/09/2011   PLT 219 02/09/2011   GLUCOSE 111* 02/09/2011   CHOL 208* 04/10/2009   TRIG 117.0 04/10/2009   HDL 36.30* 04/10/2009   LDLDIRECT 158.6 04/10/2009   LDLCALC 142* 08/03/2007   ALT 27 02/09/2011   AST 19 02/09/2011   NA 138 02/09/2011   K 4.2 02/09/2011   CL 98 02/09/2011   CREATININE 1.04 02/09/2011   BUN 15 02/09/2011   CO2 32 02/09/2011   INR 1.00 02/09/2011   HGBA1C 6.3* 02/09/2011      Assessment / Plan:      #1 dyspnea on exertion with atypical chest discomfort in the setting of positive stress test and now found to have 3 vessel coronary occlusive disease #2 hyperlipidemia untreated #3 hypertension #4 possible sleep apnea to be evaluated further #5  obesity  I have reviewed the films and patient's history with Dr. Herbie Baltimore and agree with his three-vessel coronary occlusive disease and coronary artery bypass grafting offers the best relief of symptoms and preservation of myocardial function the LAD is a complex lesion the right totally occluded with collateral filling from jeopardize circumflex system. His symptoms a month ago are likely related to the occlusion of the right coronary artery. I've discussed the risks and options with the patient and have recommended proceeding with coronary artery bypass grafting today Friday, December 28  The goals risks and alternatives of the planned surgical procedure CABG have been discussed with the patient in detail. The risks of the procedure including death, infection, stroke, myocardial infarction, bleeding, blood transfusion have all been discussed specifically.  I have quoted Joel Walters a 2% of perioperative mortality and a complication rate as high as 15%. The patient's questions have been answered.Joel Walters is willing  to proceed with the planned procedure.    Delight Ovens MD  Beeper [phone] Office [phone] 02/11/2011 7:01 AM

## 2011-02-11 NOTE — Procedures (Signed)
Extubation Procedure Note  Patient Details:   Name: Joel Walters DOB: 11/06/1950 MRN: [id number]   Airway Documentation:   Pt extubated to 3L [place] at 1755. No stridor noted. BBS equal and dim. Pt able to vocalize name.  Evaluation  O2 sats: stable throughout and currently acceptable Complications: No apparent complications Patient did tolerate procedure well. Bilateral Breath Sounds: Clear;Diminished     Christie Beckers 02/11/2011, 6:03 PM

## 2011-02-11 NOTE — Anesthesia Preprocedure Evaluation (Addendum)
Anesthesia Evaluation  Patient identified by MRN, date of birth, ID band Patient awake    Reviewed: Allergy & Precautions, H&P , NPO status , Patient's Chart, lab work & pertinent test results, reviewed documented beta blocker date and time   Airway Mallampati: I TM Distance: >3 FB Neck ROM: Full    Dental  (+) Edentulous Upper and Partial Lower   Pulmonary shortness of breath and with exertion,  clear to auscultation        Cardiovascular hypertension, Pt. on medications and Pt. on home beta blockers + CAD Regular Normal    Neuro/Psych PSYCHIATRIC DISORDERS Depression    GI/Hepatic PUD, GERD-  Controlled,  Endo/Other    Renal/GU      Musculoskeletal   Abdominal (+)  Abdomen: soft.    Peds  Hematology   Anesthesia Other Findings   Reproductive/Obstetrics                         Anesthesia Physical Anesthesia Plan  ASA: III  Anesthesia Plan: General   Post-op Pain Management:    Induction: Intravenous  Airway Management Planned: Oral ETT  Additional Equipment: Arterial line, TEE and PA Cath  Intra-op Plan:   Post-operative Plan: Possible Post-op intubation/ventilation  Informed Consent:   Dental advisory given  Plan Discussed with: CRNA and Surgeon  Anesthesia Plan Comments:         Anesthesia Quick Evaluation

## 2011-02-12 ENCOUNTER — Other Ambulatory Visit: Payer: Self-pay

## 2011-02-12 ENCOUNTER — Inpatient Hospital Stay (HOSPITAL_COMMUNITY): Payer: PRIVATE HEALTH INSURANCE

## 2011-02-12 LAB — GLUCOSE, CAPILLARY
Glucose-Capillary: 104 mg/dL — ABNORMAL HIGH (ref 70–99)
Glucose-Capillary: 109 mg/dL — ABNORMAL HIGH (ref 70–99)
Glucose-Capillary: 114 mg/dL — ABNORMAL HIGH (ref 70–99)
Glucose-Capillary: 116 mg/dL — ABNORMAL HIGH (ref 70–99)
Glucose-Capillary: 117 mg/dL — ABNORMAL HIGH (ref 70–99)
Glucose-Capillary: 119 mg/dL — ABNORMAL HIGH (ref 70–99)
Glucose-Capillary: 143 mg/dL — ABNORMAL HIGH (ref 70–99)
Glucose-Capillary: 144 mg/dL — ABNORMAL HIGH (ref 70–99)
Glucose-Capillary: 158 mg/dL — ABNORMAL HIGH (ref 70–99)
Glucose-Capillary: 159 mg/dL — ABNORMAL HIGH (ref 70–99)
Glucose-Capillary: 161 mg/dL — ABNORMAL HIGH (ref 70–99)
Glucose-Capillary: 76 mg/dL (ref 70–99)
Glucose-Capillary: 99 mg/dL (ref 70–99)

## 2011-02-12 LAB — CBC
HCT: 37.6 % — ABNORMAL LOW (ref 39.0–52.0)
Hemoglobin: 12.6 g/dL — ABNORMAL LOW (ref 13.0–17.0)
Hemoglobin: 12.8 g/dL — ABNORMAL LOW (ref 13.0–17.0)
MCH: 30.5 pg (ref 26.0–34.0)
MCHC: 34 g/dL (ref 30.0–36.0)
MCV: 89.5 fL (ref 78.0–100.0)
Platelets: 200 10*3/uL (ref 150–400)
RBC: 4.15 MIL/uL — ABNORMAL LOW (ref 4.22–5.81)
RBC: 4.2 MIL/uL — ABNORMAL LOW (ref 4.22–5.81)
RDW: 13.2 % (ref 11.5–15.5)
WBC: 10.9 10*3/uL — ABNORMAL HIGH (ref 4.0–10.5)
WBC: 19.3 10*3/uL — ABNORMAL HIGH (ref 4.0–10.5)

## 2011-02-12 LAB — POCT I-STAT, CHEM 8
BUN: 28 mg/dL — ABNORMAL HIGH (ref 6–23)
Calcium, Ion: 1.21 mmol/L (ref 1.12–1.32)
Chloride: 102 mEq/L (ref 96–112)
Creatinine, Ser: 1.1 mg/dL (ref 0.50–1.35)
Glucose, Bld: 157 mg/dL — ABNORMAL HIGH (ref 70–99)
HCT: 39 % (ref 39.0–52.0)
Hemoglobin: 13.3 g/dL (ref 13.0–17.0)
Potassium: 4.4 mEq/L (ref 3.5–5.1)
Sodium: 139 mEq/L (ref 135–145)
TCO2: 25 mmol/L (ref 0–100)

## 2011-02-12 LAB — CK TOTAL AND CKMB (NOT AT ARMC)
CK, MB: 14.3 ng/mL (ref 0.3–4.0)
Total CK: 592 U/L — ABNORMAL HIGH (ref 7–232)

## 2011-02-12 LAB — BASIC METABOLIC PANEL
CO2: 22 mEq/L (ref 19–32)
Glucose, Bld: 130 mg/dL — ABNORMAL HIGH (ref 70–99)
Potassium: 4.2 mEq/L (ref 3.5–5.1)
Sodium: 138 mEq/L (ref 135–145)

## 2011-02-12 LAB — MAGNESIUM
Magnesium: 2.5 mg/dL (ref 1.5–2.5)
Magnesium: 2.7 mg/dL — ABNORMAL HIGH (ref 1.5–2.5)

## 2011-02-12 MED ORDER — INSULIN ASPART 100 UNIT/ML ~~LOC~~ SOLN
0.0000 [IU] | SUBCUTANEOUS | Status: AC
  Administered 2011-02-12: 2 [IU] via SUBCUTANEOUS
  Filled 2011-02-12: qty 3

## 2011-02-12 MED ORDER — INSULIN ASPART 100 UNIT/ML ~~LOC~~ SOLN
0.0000 [IU] | SUBCUTANEOUS | Status: DC
  Administered 2011-02-12: 4 [IU] via SUBCUTANEOUS
  Administered 2011-02-12: 2 [IU] via SUBCUTANEOUS
  Administered 2011-02-12: 4 [IU] via SUBCUTANEOUS
  Administered 2011-02-12 – 2011-02-13 (×3): 2 [IU] via SUBCUTANEOUS

## 2011-02-12 MED ORDER — INSULIN ASPART 100 UNIT/ML ~~LOC~~ SOLN
0.0000 [IU] | SUBCUTANEOUS | Status: DC
  Administered 2011-02-12: 2 [IU] via SUBCUTANEOUS

## 2011-02-12 NOTE — Op Note (Signed)
Joel Walters, Joel Walters                 ACCOUNT NO.:  [id number]  MEDICAL RECORD NO.:  [id number]  LOCATION:  2306                         FACILITY:  [facility]  PHYSICIAN:  Sheliah Plane, MD    DATE OF BIRTH:  02/24/1950  DATE OF PROCEDURE:  02/11/2011 DATE OF DISCHARGE:                              OPERATIVE REPORT   PREOP DIAGNOSIS:  New onset of angina with 3 vessel coronary artery disease.  POSTOP DIAGNOSIS:  New onset of angina with 3 vessel coronary artery disease.  SURGICAL PROCEDURE:  Coronary artery bypass grafting x5 with the left internal mammary to the left anterior descending coronary artery, reverse saphenous vein graft to the diagonal coronary artery, reverse saphenous vein graft to the obtuse marginal coronary artery, sequential reverse saphenous vein graft to the acute marginal and posterior descending coronary artery with right thigh and calf, endo vein harvesting and left thigh endo vein harvesting.  SURGEON:  Dr. Tyrone Sage.  FIRST ASSISTANT:  Lin Landsman PA.  BRIEF HISTORY:  The patient is a 60 year old male who presented with new onset of angina.  He was evaluated by Dr. Herbie Baltimore.  Stress test showed preserved LV function with inferior ischemic changes.  Cardiac catheterizations revealed significant 3 vessel disease with 80% LAD, 80% diagonal, 70-80% circumflex with a large obtuse marginal vessel and a smaller distal circumflex high in the AV groove.  He had total disease in an acute marginal and then total occlusion of the right coronary artery with collateral filling to the posterior descending coronary artery.  Because of the patient's 3 vessel disease, positive stress test and symptoms coronary artery bypass grafting was recommended.  The patient agreed and signed informed consent.  DESCRIPTION OF PROCEDURE:  With Swan-Ganz and arterial line monitors in place, the patient underwent general endotracheal anesthesia without incident.  Skin of the chest and  legs prepped with Betadine and draped in usual sterile manner.  Using the [employer] endo vein harvesting system, vein was harvested from the right thigh and calf.  The more distal portion of this vein was small, so additional vein was harvested from the left thigh endoscopically.  A median sternotomy was performed.  Left internal mammary artery was dissected down as a pedicle graft.  The distal artery was divided, had good free flow.  Pericardium was opened. Overall ventricular function appeared preserved.  The patient was systemically heparinized.  The ascending aorta was cannulated.  The right atrium was cannulated and aortic root vent cardioplegia needle was introduced into the ascending aorta.  The patient was placed on cardiopulmonary bypass at 2.4 L/minute per m square.  Sites of anastomosis were dissected out of the epicardium.  The patient's body temperature cooled to 32 degrees.  Aortic crossclamp was applied 500 mL of cold blood potassium cardioplegia was administered with diastolic arrest of the heart.  Myocardial septal temperatures were monitored throughout the crossclamp.  Attention was turned first to the obtuse marginal coronary artery which was a large vessel was opened and admitted a 1.5 mm probe.  Using a running 7-0 Prolene distal anastomosis was performed.  The very distal circumflex branches arose from the AV groove were less than 1 mm and not  large enough for bypass.  Attention was then turned to the acute marginal coronary artery, which was opened and admitted a 1-mm probe.  The vessel was 1.3, 1.4 mm in size.  Using a longitudinal side-to-side anastomosis was carried out with a segment of reverse saphenous vein graft.  The distal extent of the same vein was then carried to the posterior descending coronary artery which was opened and was approximately 1.3 to 1.4 mm in size.  Using a running 7-0 Prolene, distal anastomosis was performed.  Additional cold  blood cardioplegia was administered down the vein grafts.  Attention was then turned to the diagonal coronary artery which was opened, admitted 1.5 mm probe.  Using a running 7 Prolene.  A segment of reverse saphenous vein graft was anastomosed to the diagonal coronary artery.  Attention was then turned to the left anterior descending coronary artery which was opened in the midportion using a running 8-0 Prolene.  Left internal mammary artery was anastomosed to left anterior descending coronary artery.  With release of the bulldog on the mammary artery, there was appropriate rise in myocardial septal temperature.  Bulldog was placed back on the mammary artery with the crossclamp still in place 3 punch aortotomies were performed and each of the 3 vein grafts were anastomosed to the ascending aorta.  Air was evacuated from the grafts and the ascending aorta.  The aortic cross-clamp was removed with the total crossclamp time of 94 min. sites and anastomoses were free of bleeding. With the body temperature rewarmed to 37 patient was ventilated and weaned from cardiopulmonary bypass without difficulty. He remained hemodynamically stable, he was decannulated in the usual fashion. Protamine sulfate was administered. Atrial and ventricular pacing wires were applied. Slight anastomosis were free of bleeding and with operative field hemostatic a left pleural tube and a Blake mediastinal drain left in place. The pericardium was reapproximated. Sternum closed with #6 stainless steel wire. Fascia was closed with interrupted 0 Vicryl. Subcutaneous tissue closed with running 2-0 Vicryl, 3-0 Vicryl subcuticular stitch was placed and the skin edges. Sponge and needle count reported as correct at the completion of the procedure. Total pump time was 125 minutes. Patient was transferred to the surgical intensive care unit for further postoperative care in stable condition. He did not require any blood bank blood products  during the procedure.       Sheliah Plane, MD     EG/MEDQ  D:  02/12/2011  T:  02/12/2011  Job:  [id number]

## 2011-02-12 NOTE — Progress Notes (Addendum)
[facility] DAILY PROGRESS NOTE  NAME:  Joel Walters   MRN: [id number] DOB:  26-Dec-1950   ADMIT DATE: 02/11/2011   Patient Description   60 y.o. male with recently diagnosed MV CAD - cath 12/19 with CT Sgx consult.  Planned CABG yesterday 12/28.  CABG x 5.   Now POD#1    Subjective:  Sore, but no CP or SOB Did stand & move around room a bit - yet to walk.  Objective:  Temp:  [96.4 F (35.8 C)-99.9 F (37.7 C)] 98 F (36.7 C) (12/29 1148) Pulse Rate:  [77-91] 86  (12/29 1100) Resp:  [12-29] 22  (12/29 1100) BP: (95-119)/(62-80) 118/80 mmHg (12/29 1100) SpO2:  [95 %-100 %] 98 % (12/29 1100) Arterial Line BP: (89-122)/(57-74) 116/74 mmHg (12/29 0800) FiO2 (%):  [39.2 %-50.4 %] 40 % (12/28 1755) Weight:  [111.7 kg (246 lb 4.1 oz)] 246 lb 4.1 oz (111.7 kg) (12/29 0500) Weight change: 2.383 kg (5 lb 4.1 oz)  Intake/Output from previous day: 12/28 0701 - 12/29 0700 In: 5424.9 [P.O.:240; I.V.:3229.9; Blood:615; IV Piggyback:1340] Out: 5310 [Urine:3165; Blood:1825; Chest Tube:320]  Intake/Output Summary (Last 24 hours) at 02/12/11 1149 Last data filed at 02/12/11 1100  Gross per 24 hour  Intake 5594.92 ml  Output   5350 ml  Net 244.92 ml    Physical Exam: General appearance: alert, cooperative, no distress and stiff & uncomfortable/ sore Neck: no adenopathy, no carotid bruit, no JVD, supple, symmetrical, trachea midline, thyroid not enlarged, symmetric, no tenderness/mass/nodules and RIJ sheath in place Lungs: clear to auscultation bilaterally and decreased BS bibasal Heart: regular rate and rhythm, S1, S2 normal, no S3 or S4 and friction rub heard throughout (as expected post pericardiotomy) Abdomen: soft, non-tender; bowel sounds normal; no masses,  no organomegaly Extremities: extremities normal, atraumatic, no cyanosis or edema Pulses: 2+ and symmetric Neurologic: Grossly normal  Lab Results: CBC    Component Value Date/Time   WBC 10.9*  02/12/2011 0419   RBC 4.15* 02/12/2011 0419   HGB 12.6* 02/12/2011 0419   HCT 36.8* 02/12/2011 0419   PLT 170 02/12/2011 0419   MCV 88.7 02/12/2011 0419   MCH 30.4 02/12/2011 0419   MCHC 34.2 02/12/2011 0419   RDW 13.0 02/12/2011 0419   Chem panel reviewed - no abnormalities   Imaging: CXR with bibasal atelectasis  CABG x 5  MAR Reviewed -- ASA, Metoprolol 12.5 mg bid, Crestor 20mg    Off Neosynephrine @ ~0400 this AM.  Assessment/Plan:  Active Problems:  S/P CABG (coronary artery bypass graft)  Standard post-op monitoring, will likely need mild diuresis.  BP currently not amenable to titrating meds up. Continue statin & ASA & BB (likely increase to 25 mg daily) Was on HCTZ prior to hospitalization for mild edema & HTN - would like to restart prior to d/c  Time Spent Directly with Patient:  15 minutes  Length of Stay:  LOS: 1 day    HARDING,DAVID W, M.D., M.S. [facility] [address] [place], [place]  [address]  [phone]  02/12/2011 11:49 AM

## 2011-02-12 NOTE — Progress Notes (Signed)
Patient ID: Joel Walters, male   DOB: Feb 12, 1951, 60 y.o.   MRN: [id number] [facility] DAILY PROGRESS NOTE                   [address]            [place] [address]          [phone]      1 Day Post-Op Procedure(s) (LRB): CORONARY ARTERY BYPASS GRAFTING (CABG) (N/A)  Total Length of Stay:  LOS: 1 day   Subjective: No complaints   Objective: Vital signs in last 24 hours: Patient Vitals for the past 24 hrs:  BP Temp Temp src Pulse Resp SpO2 Weight  02/12/11 0700 99/64 mmHg 99.3 F (37.4 C) - 77  16  96 % -  02/12/11 0600 99/69 mmHg 99.7 F (37.6 C) - 79  20  97 % -  02/12/11 0500 119/66 mmHg 99.9 F (37.7 C) - 88  21  95 % 246 lb 4.1 oz (111.7 kg)  02/12/11 0400 109/70 mmHg 99.5 F (37.5 C) - 89  19  96 % -  02/12/11 0300 113/65 mmHg 99.7 F (37.6 C) - 90  18  97 % -  02/12/11 0200 111/73 mmHg 99.7 F (37.6 C) - 90  12  97 % -  02/12/11 0100 105/64 mmHg 99.5 F (37.5 C) - 88  17  97 % -  02/12/11 0000 110/70 mmHg 99.5 F (37.5 C) - 84  18  97 % -  02/11/11 2345 - 99.3 F (37.4 C) - 85  16  98 % -  02/11/11 2330 - 99.3 F (37.4 C) - 85  15  97 % -  02/11/11 2315 - 99.3 F (37.4 C) - 87  18  99 % -  02/11/11 2300 97/69 mmHg 99.1 F (37.3 C) - 86  16  98 % -  02/11/11 2245 - 99.1 F (37.3 C) - 87  19  98 % -  02/11/11 2230 - 99.1 F (37.3 C) - 86  17  98 % -  02/11/11 2215 - 99.1 F (37.3 C) - 85  16  99 % -  02/11/11 2200 110/70 mmHg 99.1 F (37.3 C) - 85  16  98 % -  02/11/11 2145 - 99.1 F (37.3 C) - 86  17  98 % -  02/11/11 2130 - 99.1 F (37.3 C) - 86  17  98 % -  02/11/11 2115 - 99 F (37.2 C) - 85  17  98 % -  02/11/11 2100 98/67 mmHg 99.1 F (37.3 C) - 86  17  97 % -  02/11/11 2045 - 99.1 F (37.3 C) - 86  17  97 % -  02/11/11 2030 - 99.1 F (37.3 C) - 86  19  95 % -  02/11/11 2015 - 99.1 F (37.3 C) - 86  16  97 % -  02/11/11 2000 101/65 mmHg 99.1 F (37.3 C) - 86  14  98 % -  02/11/11 1945 - 99.1 F (37.3 C) - 85  15  98 % -    02/11/11 1930 - 99 F (37.2 C) - 84  14  98 % -  02/11/11 1915 - 99 F (37.2 C) - 84  15  98 % -  02/11/11 1900 - 99 F (37.2 C) Core 85  17  97 % -  02/11/11 1845 - 98.8 F (37.1 C) - 83  14  97 % -  02/11/11 1830 - 98.6 F (37 C) - 84  18  98 % -  02/11/11 1815 - 98.4 F (36.9 C) - 83  19  98 % -  02/11/11 1800 103/65 mmHg 98.2 F (36.8 C) - 90  25  98 % -  02/11/11 1755 106/63 mmHg 98.1 F (36.7 C) - 90  29  100 % -  02/11/11 1745 - 97.9 F (36.6 C) - 90  23  99 % -  02/11/11 1730 - 97.7 F (36.5 C) - 90  15  99 % -  02/11/11 1715 - 97.7 F (36.5 C) - 90  13  99 % -  02/11/11 1710 109/67 mmHg - - 90  23  99 % -  02/11/11 1700 95/67 mmHg 97.3 F (36.3 C) - 90  12  99 % -  02/11/11 1645 - 97.5 F (36.4 C) - 90  12  99 % -  02/11/11 1630 - 97.3 F (36.3 C) - 90  12  99 % -  02/11/11 1615 - 97.2 F (36.2 C) - 90  12  99 % -  02/11/11 1600 95/62 mmHg 97.3 F (36.3 C) - 90  12  99 % -  02/11/11 1545 - 97.2 F (36.2 C) - 91  12  99 % -  02/11/11 1530 - 97 F (36.1 C) - 90  12  100 % -  02/11/11 1515 - 97 F (36.1 C) - 90  12  99 % -  02/11/11 1500 - 96.8 F (36 C) Core 89  12  98 % -  02/11/11 1445 - 96.6 F (35.9 C) - 90  12  98 % -  02/11/11 1430 - 96.6 F (35.9 C) - 88  12  98 % -  02/11/11 1420 - 96.4 F (35.8 C) Core 90  12  96 % -  02/11/11 1415 - 96.4 F (35.8 C) - 90  12  96 % -  02/11/11 1414 111/63 mmHg 96.4 F (35.8 C) - 90  13  96 % -   Wt Readings from Last 3 Encounters:  02/12/11 246 lb 4.1 oz (111.7 kg)  02/12/11 246 lb 4.1 oz (111.7 kg)  02/09/11 241 lb 2.9 oz (109.4 kg)    Hemodynamic parameters for last 24 hours: PAP: (19-42)/(12-26) 35/23 mmHg CO:  [3.6 L/min-5.6 L/min] 4.5 L/min CI:  [1.6 L/min/m2-2.4 L/min/m2] 1.9 L/min/m2  Intake/Output from previous day: 12/28 0701 - 12/29 0700 In: 5384.9 [P.O.:240; I.V.:3189.9; Blood:615; IV Piggyback:1340] Out: 5310 [Urine:3165; Blood:1825; Chest Tube:320]  Intake/Output this shift:     Current Meds: Scheduled Meds:   . acetaminophen (TYLENOL) oral liquid 160 mg/5 mL  650 mg Per Tube NOW   Or  . acetaminophen  650 mg Rectal NOW  . acetaminophen  1,000 mg Oral Q6H   Or  . acetaminophen (TYLENOL) oral liquid 160 mg/5 mL  975 mg Per Tube Q6H  . aspirin EC  325 mg Oral Daily   Or  . aspirin  324 mg Per Tube Daily  . bisacodyl  10 mg Oral Daily   Or  . bisacodyl  10 mg Rectal Daily  . cefUROXime (ZINACEF)  IV  1.5 g Intravenous To OR  . cefUROXime (ZINACEF)  IV  1.5 g Intravenous Q12H  . docusate sodium  200 mg Oral Daily  . famotidine (PEPCID) IV  20 mg Intravenous Q12H  . heparin-papaverine-plasmalyte irrigation   Irrigation To OR  . insulin aspart  0-24 Units Subcutaneous Q2H   Followed by  . insulin aspart  0-24 Units Subcutaneous Q4H  . magnesium sulfate  4 g Intravenous Once  . metoprolol tartrate  12.5 mg Oral BID   Or  . metoprolol tartrate  12.5 mg Per Tube BID  . multivitamins ther. w/minerals  1 tablet Oral Daily  . pantoprazole  40 mg Oral Q1200  . phenylephrine (NEO-SYNEPHRINE) Adult infusion  30-200 mcg/min Intravenous To OR  . potassium chloride  10 mEq Intravenous Q1 Hr x 3  . rosuvastatin  20 mg Oral q1800  . sodium chloride  3 mL Intravenous Q12H  . vancomycin (VANCOCIN) IVPB 1000 mg/100 mL central line  1,000 mg Intravenous Once  . vitamin C  500 mg Oral Daily  . DISCONTD: aminocaproic acid (AMICAR) for OHS   Intravenous To OR  . DISCONTD: aminocaproic acid (AMICAR) IVPB  5 g Intravenous To OR  . DISCONTD: cefUROXime (ZINACEF)  IV  750 mg Intravenous To OR  . DISCONTD: chlorhexidine  30 mL Topical UD  . DISCONTD: dexmedetomidine (PRECEDEX) IV infusion for high rates  0.1-0.7 mcg/kg/hr Intravenous To OR  . DISCONTD: DOPamine  2-20 mcg/kg/min Intravenous To OR  . DISCONTD: epinephrine  0.5-20 mcg/min Intravenous To OR  . DISCONTD: insulin (NOVOLIN-R) infusion   Intravenous To OR  . DISCONTD: magnesium sulfate  40 mEq Other To OR  .  DISCONTD: metoprolol tartrate  12.5 mg Oral Once  . DISCONTD: nitroGLYCERIN  2-200 mcg/min Intravenous To OR  . DISCONTD: potassium chloride  80 mEq Other To OR   Continuous Infusions:   . sodium chloride 20 mL/hr at 02/11/11 1900  . sodium chloride    . sodium chloride    . dexmedetomidine (PRECEDEX) IV infusion Stopped (02/11/11 1646)  . insulin (NOVOLIN-R) infusion 2 Units/hr (02/11/11 1851)  . lactated ringers 20 mL/hr at 02/11/11 1900  . nitroGLYCERIN Stopped (02/11/11 1430)  . phenylephrine (NEO-SYNEPHRINE) Adult infusion Stopped (02/11/11 1835)   PRN Meds:.albumin human, lactated ringers, metoprolol, midazolam, morphine injection, morphine, ondansetron (ZOFRAN) IV, sodium chloride, traMADol, DISCONTD: 0.9 % irrigation (POUR BTL), DISCONTD: 0.9 % irrigation (POUR BTL), DISCONTD: hemostatic agents, DISCONTD: papaverine, DISCONTD: Surgifoam 1 Gm with 0.9% sodium chloride (4 ml) topical solution, DISCONTD: Surgifoam 1 Gm with 0.9% sodium chloride (4 ml) topical solution DISCONTD: Surgifoam 1 Gm with 0.9% sodium chloride (4 ml) topical solution  General appearance: alert, cooperative and no distress Neurologic: intact Heart: regular rate and rhythm, S1, S2 normal, no murmur, click, rub or gallop and normal apical impulse Lungs: clear to auscultation bilaterally Abdomen: soft, non-tender; bowel sounds normal; no masses,  no organomegaly  Lab Results: CBC: Basename 02/12/11 0419 02/11/11 2032  WBC 10.9* 13.4*  HGB 12.6* 13.1  HCT 36.8* 37.4*  PLT 170 158   BMET:  Basename 02/12/11 0419 02/11/11 2032 02/11/11 2011 02/09/11 1110  NA 138 -- 141 --  K 4.2 -- 4.2 --  CL 106 -- 107 --  CO2 22 -- -- 32  GLUCOSE 130* -- 113* --  BUN 17 -- 15 --  CREATININE 0.92 0.89 -- --  CALCIUM 8.5 -- -- 9.9    PT/INR:  Basename 02/11/11 1425  LABPROT 16.8*  INR 1.34   Radiology: Dg Chest Portable 1 View  02/11/2011  *RADIOLOGY REPORT*  Clinical Data: Postop CABG  PORTABLE CHEST - 1 VIEW   Comparison: 01/31/2011  Findings: Grossly unchanged cardiac silhouette and mediastinal contours post median sternotomy and CABG.  Endotracheal tube overlies tracheal air  column tip superior to the carina. Mediastinal drains and left sided chest tube.  No pneumothorax. Right jugular approach central venous catheter with tip overlying the main pulmonary artery.  Decreased lung volumes with bibasilar heterogeneous / consolidative opacities, left greater than right. No pleural effusion.  Unchanged bones.  IMPRESSION: 1.  Appropriately positioned support apparatus post median sternotomy and CABG.  No pneumothorax. 2.  Decreased lung volumes with bibasilar heterogeneous / consolidative opacities, favor atelectasis.  Original Report Authenticated By: Waynard Reeds, M.D.     Assessment/Plan: S/P Procedure(s) (LRB): CORONARY ARTERY BYPASS GRAFTING (CABG) (N/A) Mobilize Diuresis d/c tubes/lines See progression orders     Delight Ovens MD  Beeper [phone] Office [phone] 02/12/2011 7:52 AM

## 2011-02-13 ENCOUNTER — Inpatient Hospital Stay (HOSPITAL_COMMUNITY): Payer: PRIVATE HEALTH INSURANCE

## 2011-02-13 LAB — CBC
HCT: 36.3 % — ABNORMAL LOW (ref 39.0–52.0)
Hemoglobin: 12.3 g/dL — ABNORMAL LOW (ref 13.0–17.0)
MCH: 30.4 pg (ref 26.0–34.0)
MCHC: 33.9 g/dL (ref 30.0–36.0)
MCV: 89.6 fL (ref 78.0–100.0)
Platelets: 182 10*3/uL (ref 150–400)
RBC: 4.05 MIL/uL — ABNORMAL LOW (ref 4.22–5.81)
RDW: 13.2 % (ref 11.5–15.5)
WBC: 18.2 10*3/uL — ABNORMAL HIGH (ref 4.0–10.5)

## 2011-02-13 LAB — GLUCOSE, CAPILLARY
Glucose-Capillary: 120 mg/dL — ABNORMAL HIGH (ref 70–99)
Glucose-Capillary: 136 mg/dL — ABNORMAL HIGH (ref 70–99)
Glucose-Capillary: 132 mg/dL — ABNORMAL HIGH (ref 70–99)
Glucose-Capillary: 121 mg/dL — ABNORMAL HIGH (ref 70–99)

## 2011-02-13 LAB — BASIC METABOLIC PANEL
BUN: 28 mg/dL — ABNORMAL HIGH (ref 6–23)
CO2: 27 mEq/L (ref 19–32)
Calcium: 9 mg/dL (ref 8.4–10.5)
Chloride: 101 mEq/L (ref 96–112)
Creatinine, Ser: 0.99 mg/dL (ref 0.50–1.35)
GFR calc Af Amer: >90 mL/min (ref >90)
GFR calc non Af Amer: 88 mL/min — ABNORMAL LOW (ref >90)
Glucose, Bld: 131 mg/dL — ABNORMAL HIGH (ref 70–99)
Potassium: 4.4 mEq/L (ref 3.5–5.1)
Sodium: 135 mEq/L (ref 135–145)

## 2011-02-13 MED ORDER — LISINOPRIL 5 MG PO TABS: 5.0000 mg | ORAL_TABLET | Freq: Every day | ORAL | Status: DC

## 2011-02-13 MED ORDER — ENOXAPARIN SODIUM 40 MG/0.4ML ~~LOC~~ SOLN
40.0000 mg | SUBCUTANEOUS | Status: DC
  Administered 2011-02-13 – 2011-02-15 (×3): 40 mg via SUBCUTANEOUS
  Filled 2011-02-13 (×4): qty 0.4

## 2011-02-13 MED ORDER — FUROSEMIDE 40 MG PO TABS
40.0000 mg | ORAL_TABLET | Freq: Every day | ORAL | Status: AC
  Administered 2011-02-13 – 2011-02-15 (×3): 40 mg via ORAL
  Filled 2011-02-13 (×3): qty 1

## 2011-02-13 MED ORDER — BISACODYL 5 MG PO TBEC: 10.0000 mg | DELAYED_RELEASE_TABLET | Freq: Every day | ORAL | Status: DC | PRN

## 2011-02-13 MED ORDER — TRAMADOL HCL 50 MG PO TABS
50.0000 mg | ORAL_TABLET | ORAL | Status: DC | PRN
  Administered 2011-02-13: 50 mg via ORAL
  Administered 2011-02-13 – 2011-02-14 (×5): 100 mg via ORAL
  Administered 2011-02-15: 50 mg via ORAL
  Administered 2011-02-15: 100 mg via ORAL
  Administered 2011-02-15: 50 mg via ORAL
  Filled 2011-02-13: qty 2
  Filled 2011-02-13 (×2): qty 1
  Filled 2011-02-13 (×2): qty 2
  Filled 2011-02-13: qty 1
  Filled 2011-02-13 (×3): qty 2

## 2011-02-13 MED ORDER — SODIUM CHLORIDE 0.9 % IJ SOLN
3.0000 mL | Freq: Two times a day (BID) | INTRAMUSCULAR | Status: DC
  Administered 2011-02-13 – 2011-02-15 (×6): 3 mL via INTRAVENOUS

## 2011-02-13 MED ORDER — DOCUSATE SODIUM 100 MG PO CAPS
200.0000 mg | ORAL_CAPSULE | Freq: Every day | ORAL | Status: DC
  Administered 2011-02-13 – 2011-02-16 (×4): 200 mg via ORAL
  Filled 2011-02-13 (×4): qty 2

## 2011-02-13 MED ORDER — ONDANSETRON HCL 4 MG/2ML IJ SOLN: 4.0000 mg | Freq: Four times a day (QID) | INTRAMUSCULAR | Status: DC | PRN

## 2011-02-13 MED ORDER — METOPROLOL TARTRATE 25 MG PO TABS
25.0000 mg | ORAL_TABLET | Freq: Two times a day (BID) | ORAL | Status: DC
  Administered 2011-02-13 – 2011-02-14 (×4): 25 mg via ORAL
  Filled 2011-02-13 (×6): qty 1

## 2011-02-13 MED ORDER — BISACODYL 10 MG RE SUPP: 10.0000 mg | Freq: Every day | RECTAL | Status: DC | PRN

## 2011-02-13 MED ORDER — INSULIN ASPART 100 UNIT/ML ~~LOC~~ SOLN
0.0000 [IU] | Freq: Three times a day (TID) | SUBCUTANEOUS | Status: DC
  Administered 2011-02-13: 2 [IU] via SUBCUTANEOUS
  Filled 2011-02-13: qty 3

## 2011-02-13 MED ORDER — ASPIRIN EC 325 MG PO TBEC
325.0000 mg | DELAYED_RELEASE_TABLET | Freq: Every day | ORAL | Status: DC
  Administered 2011-02-13 – 2011-02-16 (×4): 325 mg via ORAL
  Filled 2011-02-13 (×4): qty 1

## 2011-02-13 MED ORDER — PANTOPRAZOLE SODIUM 40 MG PO TBEC
40.0000 mg | DELAYED_RELEASE_TABLET | Freq: Every day | ORAL | Status: DC
  Administered 2011-02-14 – 2011-02-16 (×3): 40 mg via ORAL
  Filled 2011-02-13 (×3): qty 1

## 2011-02-13 MED ORDER — ONDANSETRON HCL 4 MG PO TABS
4.0000 mg | ORAL_TABLET | Freq: Four times a day (QID) | ORAL | Status: DC | PRN
  Administered 2011-02-13 – 2011-02-14 (×2): 4 mg via ORAL
  Filled 2011-02-13 (×2): qty 1

## 2011-02-13 MED ORDER — MOVING RIGHT ALONG BOOK
Freq: Once | Status: AC
  Administered 2011-02-13: 10:00:00
  Filled 2011-02-13: qty 1

## 2011-02-13 MED ORDER — SODIUM CHLORIDE 0.9 % IV SOLN: 250.0000 mL | INTRAVENOUS | Status: DC | PRN

## 2011-02-13 MED ORDER — LISINOPRIL 2.5 MG PO TABS
2.5000 mg | ORAL_TABLET | Freq: Every day | ORAL | Status: DC
  Administered 2011-02-13: 2.5 mg via ORAL
  Filled 2011-02-13 (×2): qty 1

## 2011-02-13 MED ORDER — SODIUM CHLORIDE 0.9 % IJ SOLN: 3.0000 mL | INTRAMUSCULAR | Status: DC | PRN

## 2011-02-13 MED ORDER — ROSUVASTATIN CALCIUM 10 MG PO TABS
10.0000 mg | ORAL_TABLET | Freq: Every day | ORAL | Status: DC
  Administered 2011-02-13 – 2011-02-15 (×3): 10 mg via ORAL
  Filled 2011-02-13 (×4): qty 1

## 2011-02-13 MED ORDER — POTASSIUM CHLORIDE 10 MEQ PO TBCR
10.0000 meq | EXTENDED_RELEASE_TABLET | Freq: Every day | ORAL | Status: AC
  Administered 2011-02-13 – 2011-02-15 (×3): 10 meq via ORAL
  Filled 2011-02-13 (×3): qty 1

## 2011-02-13 MED ORDER — ACETAMINOPHEN 325 MG PO TABS
650.0000 mg | ORAL_TABLET | Freq: Four times a day (QID) | ORAL | Status: DC | PRN
Start: 2011-02-13 — End: 2011-02-16
  Administered 2011-02-13: 1000 mg via ORAL
  Administered 2011-02-14 – 2011-02-15 (×5): 650 mg via ORAL
  Filled 2011-02-13 (×7): qty 2

## 2011-02-13 MED ORDER — POVIDONE-IODINE 10 % EX SOLN: 1.0000 "application " | Freq: Two times a day (BID) | CUTANEOUS | Status: DC

## 2011-02-13 NOTE — Progress Notes (Signed)
Transferred to 2015 via wheelchair--portable monitor and 02 on.  No changes.

## 2011-02-13 NOTE — Progress Notes (Signed)
[facility] DAILY PROGRESS NOTE  NAME:  Joel Walters   MRN: [id number] DOB:  25-May-1950   ADMIT DATE: 02/11/2011   Patient Description   60 y.o. male with recently diagnosed MV CAD - cath 12/19 with CT Sgx consult.  Planned CABG yesterday 12/28.  CABG x 5.   Now POD#2    Subjective:  Sore, but no CP or SOB Walked yesterday.    Objective:  Temp:  [97.6 F (36.4 C)-98.3 F (36.8 C)] 97.6 F (36.4 C) (12/30 1150) Pulse Rate:  [78-102] 84  (12/30 1100) Resp:  [14-24] 17  (12/30 1100) BP: (109-164)/(72-113) 134/89 mmHg (12/30 1100) SpO2:  [91 %-98 %] 96 % (12/30 1100) Weight:  [112.2 kg (247 lb 5.7 oz)] 247 lb 5.7 oz (112.2 kg) (12/30 0600) Weight change: 2.9 kg (6 lb 6.3 oz)  Intake/Output from previous day: 12/29 0701 - 12/30 0700 In: 1650 [P.O.:1080; I.V.:520; IV Piggyback:50] Out: 950 [Urine:930; Chest Tube:20]  Intake/Output Summary (Last 24 hours) at 02/13/11 1205 Last data filed at 02/13/11 1100  Gross per 24 hour  Intake   1000 ml  Output   1035 ml  Net    -35 ml    Physical Exam: General appearance: alert, cooperative, no distress and stiff & uncomfortable/ sore Neck: no adenopathy, no carotid bruit, no JVD, supple, symmetrical, trachea midline, thyroid not enlarged, symmetric, no tenderness/mass/nodules and RIJ sheath in place Lungs: clear to auscultation bilaterally and decreased BS bibasal Heart: regular rate and rhythm, S1, S2 normal, no S3 or S4 and friction rub heard throughout (as expected post pericardiotomy) Abdomen: soft, non-tender; bowel sounds normal; no masses,  no organomegaly Extremities: extremities normal, atraumatic, no cyanosis or edema Pulses: 2+ and symmetric Neurologic: Grossly normal  Lab Results: CBC    Component Value Date/Time   WBC 18.2* 02/13/2011 0425   RBC 4.05* 02/13/2011 0425   HGB 12.3* 02/13/2011 0425   HCT 36.3* 02/13/2011 0425   PLT 182 02/13/2011 0425   MCV 89.6 02/13/2011 0425   MCH  30.4 02/13/2011 0425   MCHC 33.9 02/13/2011 0425   RDW 13.2 02/13/2011 0425   Chem panel reviewed - no abnormalities   Imaging: CXR with bibasal atelectasis  CABG x 5  MAR Reviewed -- ASA, Metoprolol 12.5 mg bid, Crestor 20mg  Treatment Team:  Marykay Lex   Assessment/Plan:  Active Problems:  S/P CABG (coronary artery bypass graft)  POD #2 - looks great HTN HLD  Standard post-op monitoring, will likely need mild diuresis.  BP currently not amenable to titrating meds up. Continue statin & ASA & BB (likely increase to 25 mg daily) - added low-dose ACE-I today as BP is coming up. Was on HCTZ prior to hospitalization for mild edema & HTN - would like to restart prior to d/c once diuresis with Lasix complete  Plan to transfer today Telemetry.  Time Spent Directly with Patient:  15 minutes  Length of Stay:  LOS: 2 days    HARDING,DAVID W, M.D., M.S. [facility] [address] [place], [place]  [address]  [phone]  02/13/2011 12:05 PM

## 2011-02-13 NOTE — Progress Notes (Signed)
[facility] DAILY PROGRESS NOTE                   [address]            [place] [address]          [phone]      2 Days Post-Op Procedure(s) (LRB): CORONARY ARTERY BYPASS GRAFTING (CABG) (N/A)  Total Length of Stay:  LOS: 2 days   Subjective: Stable night, no sob, adequate pain control  Objective: Vital signs in last 24 hours: Patient Vitals for the past 24 hrs:  BP Temp Temp src Pulse Resp SpO2 Weight  02/13/11 0800 140/92 mmHg - - 99  15  95 % -  02/13/11 0738 - 98.3 F (36.8 C) Oral - - - -  02/13/11 0700 146/76 mmHg - - 93  20  92 % -  02/13/11 0600 143/90 mmHg - - 89  15  94 % 247 lb 5.7 oz (112.2 kg)  02/13/11 0500 135/93 mmHg - - 86  14  95 % -  02/13/11 0400 136/88 mmHg - - 89  21  97 % -  02/13/11 0352 - 98 F (36.7 C) Oral - - - -  02/13/11 0300 130/79 mmHg - - 85  14  95 % -  02/13/11 0200 143/87 mmHg - - 86  15  97 % -  02/13/11 0100 125/88 mmHg - - 79  15  95 % -  02/13/11 0000 134/88 mmHg - - 87  18  94 % -  02/12/11 2334 - 97.7 F (36.5 C) Oral - - - -  02/12/11 2300 142/92 mmHg - - 86  16  95 % -  02/12/11 2200 142/77 mmHg - - 88  20  95 % -  02/12/11 2100 130/81 mmHg - - 86  18  96 % -  02/12/11 2006 - 97.9 F (36.6 C) Oral - - - -  02/12/11 2000 141/113 mmHg - - 85  20  97 % -  02/12/11 1900 164/93 mmHg - - 86  22  98 % -  02/12/11 1800 129/80 mmHg - - 88  23  92 % -  02/12/11 1700 143/97 mmHg - - 81  21  95 % -  02/12/11 1600 133/96 mmHg - - 78  19  95 % -  02/12/11 1556 - 98.2 F (36.8 C) Oral - - - -  02/12/11 1500 119/72 mmHg - - 78  17  91 % -  02/12/11 1400 122/78 mmHg - - 80  24  95 % -  02/12/11 1300 109/76 mmHg - - 79  20  94 % -  02/12/11 1200 117/80 mmHg - - 78  21  96 % -  02/12/11 1148 - 98 F (36.7 C) Oral - - - -  02/12/11 1100 118/80 mmHg - - 86  22  98 % -  02/12/11 1000 113/76 mmHg - - 84  22  97 % -   Wt Readings from Last 3 Encounters:  02/13/11 247 lb 5.7 oz (112.2 kg)  02/13/11 247 lb 5.7 oz (112.2 kg)    02/09/11 241 lb 2.9 oz (109.4 kg)    Hemodynamic parameters for last 24 hours:    Intake/Output from previous day: 12/29 0701 - 12/30 0700 In: 1650 [P.O.:1080; I.V.:520; IV Piggyback:50] Out: 950 [Urine:930; Chest Tube:20]  Intake/Output this shift: Total I/O In: 20 [I.V.:20] Out: 30 [Urine:30]  Current Meds: Scheduled Meds:   .  acetaminophen  1,000 mg Oral Q6H   Or  . acetaminophen (TYLENOL) oral liquid 160 mg/5 mL  975 mg Per Tube Q6H  . aspirin EC  325 mg Oral Daily   Or  . aspirin  324 mg Per Tube Daily  . bisacodyl  10 mg Oral Daily   Or  . bisacodyl  10 mg Rectal Daily  . docusate sodium  200 mg Oral Daily  . famotidine (PEPCID) IV  20 mg Intravenous Q12H  . insulin aspart  0-24 Units Subcutaneous Q2H   Followed by  . insulin aspart  0-24 Units Subcutaneous Q4H  . metoprolol tartrate  12.5 mg Oral BID   Or  . metoprolol tartrate  12.5 mg Per Tube BID  . multivitamins ther. w/minerals  1 tablet Oral Daily  . pantoprazole  40 mg Oral Q1200  . rosuvastatin  20 mg Oral q1800  . sodium chloride  3 mL Intravenous Q12H  . vitamin C  500 mg Oral Daily  . DISCONTD: cefUROXime (ZINACEF)  IV  1.5 g Intravenous Q12H   Continuous Infusions:   . sodium chloride 20 mL/hr at 02/11/11 1900  . sodium chloride    . sodium chloride    . dexmedetomidine (PRECEDEX) IV infusion Stopped (02/11/11 1646)  . insulin (NOVOLIN-R) infusion 2 Units/hr (02/11/11 1851)  . lactated ringers 20 mL/hr at 02/11/11 1900  . nitroGLYCERIN Stopped (02/11/11 1430)  . phenylephrine (NEO-SYNEPHRINE) Adult infusion Stopped (02/11/11 1835)   PRN Meds:.albumin human, metoprolol, midazolam, morphine, ondansetron (ZOFRAN) IV, sodium chloride, traMADol  General appearance: alert, cooperative and no distress Neurologic: intact Heart: regular rate and rhythm, S1, S2 normal, no murmur, click, rub or gallop Lungs: clear to auscultation bilaterally Abdomen: soft, non-tender; bowel sounds normal; no  masses,  no organomegaly Extremities: extremities normal, atraumatic, no cyanosis or edema, Homans sign is negative, no sign of DVT and no edema, redness or tenderness in the calves or thighs  Lab Results: CBC: Basename 02/13/11 0425 02/12/11 1709 02/12/11 1700  WBC 18.2* -- 19.3*  HGB 12.3* 13.3 --  HCT 36.3* 39.0 --  PLT 182 -- 200   BMET:  Basename 02/13/11 0425 02/12/11 1709 02/12/11 0419  NA 135 139 --  K 4.4 4.4 --  CL 101 102 --  CO2 27 -- 22  GLUCOSE 131* 157* --  BUN 28* 28* --  CREATININE 0.99 1.10 --  CALCIUM 9.0 -- 8.5    PT/INR:  Basename 02/11/11 1425  LABPROT 16.8*  INR 1.34   Radiology: Dg Chest Portable 1 View In Am  02/13/2011  *RADIOLOGY REPORT*  Clinical Data: Postop CABG.  PORTABLE CHEST - 1 VIEW  Comparison: 02/12/2011  Findings: Swan-Ganz catheter and chest drains have been removed. No evidence for a pneumothorax.  Persistent densities at the left lung bases suggestive for atelectasis and possibly pleural fluid. Heart size is grossly stable and median sternotomy wires are present.  IMPRESSION: Removal of support apparatuses without a pneumothorax.  Persistent left basilar densities.  Original Report Authenticated By: Richarda Overlie, M.D.   Dg Chest Portable 1 View In Am  02/12/2011  *RADIOLOGY REPORT*  Clinical Data: Postop and evaluate chest tube.  PORTABLE CHEST - 1 VIEW  Comparison: 02/11/2011  Findings: Endotracheal tube has been removed.  Swan-Ganz catheter in the right pulmonary artery region.  Mediastinal and left chest tube are still in place.  There are increased densities at the lung bases most likely representing atelectasis.  No evidence for a pneumothorax.  IMPRESSION: Removal  of endotracheal tube with increased basilar atelectasis.  No evidence for a pneumothorax.  Original Report Authenticated By: Richarda Overlie, M.D.   Dg Chest Portable 1 View  02/11/2011  *RADIOLOGY REPORT*  Clinical Data: Postop CABG  PORTABLE CHEST - 1 VIEW  Comparison: 01/31/2011   Findings: Grossly unchanged cardiac silhouette and mediastinal contours post median sternotomy and CABG.  Endotracheal tube overlies tracheal air column tip superior to the carina. Mediastinal drains and left sided chest tube.  No pneumothorax. Right jugular approach central venous catheter with tip overlying the main pulmonary artery.  Decreased lung volumes with bibasilar heterogeneous / consolidative opacities, left greater than right. No pleural effusion.  Unchanged bones.  IMPRESSION: 1.  Appropriately positioned support apparatus post median sternotomy and CABG.  No pneumothorax. 2.  Decreased lung volumes with bibasilar heterogeneous / consolidative opacities, favor atelectasis.  Original Report Authenticated By: Waynard Reeds, M.D.     Assessment/Plan: S/P Procedure(s) (LRB): CORONARY ARTERY BYPASS GRAFTING (CABG) (N/A) Mobilize Diuresis Plan for transfer to step-down: see transfer orders Leucocytosis improving Mild sinus tachycardia increase dose of lopressor    Delight Ovens MD  Beeper [phone] Office [phone] 02/13/2011 9:05 AM

## 2011-02-13 NOTE — Progress Notes (Signed)
Pt refused to ambulate.  Tata Timmins T 

## 2011-02-13 NOTE — Progress Notes (Signed)
ANESTHESIA PROGRESS NOTE  Subjective: Joel Walters is alert and oriented and is sitting up in bed. His vital signs are stable and he is taking liquids by mouth without difficulty. Vital signs: Blood pressure: 135/93 heart rate: 86 sinus rhythm respiratory rate 14 temperature: 36.7 oxygen saturation 95% on 2 L nasal cannula appeared   Labs: Lab Results  Component Value Date   WBC 18.2* 02/13/2011   HGB 12.3* 02/13/2011   HCT 36.3* 02/13/2011   MCV 89.6 02/13/2011   PLT 182 02/13/2011   Lab Results  Component Value Date   NA 139 02/12/2011   K 4.4 02/12/2011   CL 102 02/12/2011   CO2 22 02/12/2011   BUN 28* 02/12/2011   CREATININE 1.10 02/12/2011   GLUCOSE 157* 02/12/2011   ABG    Component Value Date/Time   PHART 7.407 02/11/2011 1753   PCO2ART 36.3 02/11/2011 1753   PO2ART 124.0* 02/11/2011 1753   HCO3 22.9 02/11/2011 1753   TCO2 25 02/12/2011 1709   ACIDBASEDEF 1.0 02/11/2011 1753   O2SAT 99.0 02/11/2011 1753    Other findings:  Physical Exam: There were no vitals filed for this visit.  Mental status: Alert and oriented neurologically intact    Assessment/Plan: 60 y.o. male  2 days status post CABG x5. He has had a stable postoperative course so far and was extubated 3-1/2 hours postoperatively.   Melonie Florida, MD 02/13/2011 5:09 AM            ABG

## 2011-02-14 ENCOUNTER — Inpatient Hospital Stay (HOSPITAL_COMMUNITY): Payer: PRIVATE HEALTH INSURANCE

## 2011-02-14 LAB — CBC
HCT: 34.4 % — ABNORMAL LOW (ref 39.0–52.0)
Hemoglobin: 11.3 g/dL — ABNORMAL LOW (ref 13.0–17.0)
MCH: 29.7 pg (ref 26.0–34.0)
MCHC: 32.8 g/dL (ref 30.0–36.0)
MCV: 90.5 fL (ref 78.0–100.0)
Platelets: 220 10*3/uL (ref 150–400)
RBC: 3.8 MIL/uL — ABNORMAL LOW (ref 4.22–5.81)
RDW: 13.1 % (ref 11.5–15.5)
WBC: 13.6 10*3/uL — ABNORMAL HIGH (ref 4.0–10.5)

## 2011-02-14 LAB — BASIC METABOLIC PANEL
BUN: 24 mg/dL — ABNORMAL HIGH (ref 6–23)
CO2: 32 mEq/L (ref 19–32)
Calcium: 9.2 mg/dL (ref 8.4–10.5)
Chloride: 96 mEq/L (ref 96–112)
Creatinine, Ser: 0.86 mg/dL (ref 0.50–1.35)
GFR calc Af Amer: >90 mL/min (ref >90)
GFR calc non Af Amer: >90 mL/min (ref >90)
Glucose, Bld: 117 mg/dL — ABNORMAL HIGH (ref 70–99)
Potassium: 3.7 mEq/L (ref 3.5–5.1)
Sodium: 135 mEq/L (ref 135–145)

## 2011-02-14 LAB — GLUCOSE, CAPILLARY
Glucose-Capillary: 116 mg/dL — ABNORMAL HIGH (ref 70–99)
Glucose-Capillary: 103 mg/dL — ABNORMAL HIGH (ref 70–99)

## 2011-02-14 MED ORDER — LISINOPRIL 5 MG PO TABS
5.0000 mg | ORAL_TABLET | Freq: Every day | ORAL | Status: DC
  Administered 2011-02-14 – 2011-02-16 (×3): 5 mg via ORAL
  Filled 2011-02-14 (×3): qty 1

## 2011-02-14 MED ORDER — LACTULOSE 10 GM/15ML PO SOLN
20.0000 g | Freq: Once | ORAL | Status: AC
  Administered 2011-02-14: 20 g via ORAL
  Filled 2011-02-14 (×2): qty 30

## 2011-02-14 MED FILL — Dexmedetomidine HCl IV Soln 200 MCG/2ML: INTRAVENOUS | Qty: 2 | Status: AC

## 2011-02-14 MED FILL — Magnesium Sulfate Inj 50%: INTRAMUSCULAR | Qty: 10 | Status: AC

## 2011-02-14 MED FILL — Potassium Chloride Inj 2 mEq/ML: INTRAVENOUS | Qty: 40 | Status: AC

## 2011-02-14 NOTE — Progress Notes (Signed)
[facility] DAILY PROGRESS NOTE  NAME:  Joel Walters   MRN: [id number] DOB:  01/13/1951   ADMIT DATE: 02/11/2011   Patient Description   60 y.o. male with recently diagnosed MV CAD - cath 12/19 with CT Sgx consult.  Planned CABG yesterday 12/28.  CABG x 5.   Now POD#2    Subjective:  Walked Sore - having issues with pain control No BM yet Wife reported ? Rapid HR with ? AFib, but no documentation noted.  Objective:  Temp:  [97.6 F (36.4 C)-98.2 F (36.8 C)] 98 F (36.7 C) (12/31 1329) Pulse Rate:  [94-106] 94  (12/31 1329) Resp:  [18-19] 19  (12/31 1329) BP: (144-152)/(80-86) 150/83 mmHg (12/31 1329) SpO2:  [98 %-100 %] 100 % (12/31 1329) Weight:  [111.131 kg (245 lb)] 245 lb (111.131 kg) (12/31 0339) Weight change: -1.069 kg (-2 lb 5.7 oz)  Intake/Output from previous day: 12/30 0701 - 12/31 0700 In: 470 [P.O.:450; I.V.:20] Out: 630 [Urine:630]  Intake/Output Summary (Last 24 hours) at 02/14/11 1452 Last data filed at 02/14/11 1300  Gross per 24 hour  Intake    570 ml  Output   1001 ml  Net   -431 ml    Physical Exam: General appearance: alert, cooperative, no distress and stiff & uncomfortable/ sore Neck: no adenopathy, no carotid bruit, no JVD, supple, symmetrical, trachea midline, thyroid not enlarged, symmetric, no tenderness/mass/nodules and RIJ sheath in place Lungs: clear to auscultation bilaterally and decreased BS bibasal Heart: regular rate and rhythm, S1, S2 normal, no S3 or S4 and friction rub heard throughout (as expected post pericardiotomy) Abdomen: soft, non-tender; bowel sounds normal; no masses,  no organomegaly Extremities: extremities normal, atraumatic, no cyanosis or edema Pulses: 2+ and symmetric Neurologic: Grossly normal  Lab Results: CBC    Component Value Date/Time   WBC 13.6* 02/14/2011 0628   RBC 3.80* 02/14/2011 0628   HGB 11.3* 02/14/2011 0628   HCT 34.4* 02/14/2011 0628   PLT 220 02/14/2011  0628   MCV 90.5 02/14/2011 0628   MCH 29.7 02/14/2011 0628   MCHC 32.8 02/14/2011 0628   RDW 13.1 02/14/2011 0628   Chem panel reviewed - no abnormalities   Imaging: CXR with bibasal atelectasis  CABG x 5  MAR Reviewed -- ASA, Metoprolol 12.5 mg bid, Crestor 20mg  Treatment Team:  Marykay Lex   Assessment/Plan:  Active Problems:  S/P CABG (coronary artery bypass graft)  POD #3 - looks great, no BM yet, quite sore HTN HLD Leukocytosis - decreasing.  Standard post-op monitoring, will likely need mild diuresis.   BP stable, agree with increasing ACE-I. Continue statin & ASA & BB (likely increase to 25 mg daily) Was on HCTZ prior to hospitalization for mild edema & HTN - would like to restart prior to d/c once diuresis with Lasix complete   Time Spent Directly with Patient:  10 minutes  Length of Stay:  LOS: 3 days    HARDING,DAVID W, M.D., M.S. [facility] [address] [place], [place]  [address]  [phone]  02/14/2011 2:52 PM

## 2011-02-14 NOTE — Progress Notes (Signed)
CARDIAC REHAB PHASE I   PRE:  Rate/Rhythm: 101 SR  BP:  Supine:   Sitting: 128/72  Standing:    SaO2: 97 3L  MODE:  Ambulation: 300 ft   POST:  Rate/Rhythem: 111 SR  BP:  Supine:  Sitting: 141/88  Standing:    SaO2: 95 on RA  Joel Walters [employer] received and appreciated. Chart reviewed. Pt ambulated 300 ft Assist x 1 with rolling walker and 3L O2. Pt tolerated well, but c/o of incisional pain. O2 Sats 100% on 3L, 100% on 2L, and 95% on RA on second half of ambulation. Pt reported only mild dyspnea. Pt could probably be ambulated on RA from now on if rest breaks are taken. Returned to [employer]. Call bell in reach and spouse in attendance. Will follow up. Harriett Sine MS

## 2011-02-14 NOTE — Progress Notes (Signed)
UR Completed.  Joel Walters [phone]. 02/14/2011  

## 2011-02-14 NOTE — Progress Notes (Addendum)
3 Days Post-Op Procedure(s) (LRB): CORONARY ARTERY BYPASS GRAFTING (CABG) (N/A)  Subjective: Patient passing flatus but no bm yet. No other complaints.  Objective: Vital signs in last 24 hours: Patient Vitals for the past 24 hrs:  BP Temp Temp src Pulse Resp SpO2 Weight  02/14/11 0339 152/86 mmHg 97.6 F (36.4 C) Oral 96  18  100 % 245 lb (111.131 kg)  02/13/11 2135 144/80 mmHg 98.2 F (36.8 C) Oral 106  18  98 % -  02/13/11 1424 127/84 mmHg 98 F (36.7 C) - 98  18  95 % -  02/13/11 1200 145/98 mmHg - - 91  18  98 % -  02/13/11 1150 - 97.6 F (36.4 C) Oral - - - -  02/13/11 1100 134/89 mmHg - - 84  17  96 % -  02/13/11 1000 140/85 mmHg - - 89  19  94 % -  02/13/11 0900 135/92 mmHg - - 102  21  93 % -  02/13/11 0800 140/92 mmHg - - 99  15  95 % -  02/13/11 0738 - 98.3 F (36.8 C) Oral - - - -   Pre op weight 109 kg Current Weight  02/14/11 245 lb (111.131 kg)       Intake/Output from previous day: 12/30 0701 - 12/31 0700 In: 470 [P.O.:450; I.V.:20] Out: 630 [Urine:630]   Physical Exam:  Cardiovascular: RRR, no murmurs, gallops, or rubs. Pulmonary: Decreased at the bases; no rales, wheezes, or rhonchi. Abdomen: Soft, non tender, some distention,bowel sounds present. Extremities: Bilateral lower extremity edema. Wounds: Clean and dry.  No erythema or signs of infection.  Lab Results: CBC: Basename 02/14/11 0628 02/13/11 0425  WBC 13.6* 18.2*  HGB 11.3* 12.3*  HCT 34.4* 36.3*  PLT 220 182   BMET:  Basename 02/13/11 0425 02/12/11 1709 02/12/11 0419  NA 135 139 --  K 4.4 4.4 --  CL 101 102 --  CO2 27 -- 22  GLUCOSE 131* 157* --  BUN 28* 28* --  CREATININE 0.99 1.10 --  CALCIUM 9.0 -- 8.5    PT/INR:  Basename 02/11/11 1425  LABPROT 16.8*  INR 1.34   ABG:  INR: Will add last result for INR, ABG once components are confirmed Will add last 4 CBG results once components are confirmed  Assessment/Plan:  1. CV - SR.Continue Lopressor 25 bid and will  increase Lisinopril to 5 daily for better bp control. 2.  Pulmonary - Encourage incentive spirometer and flutter valve.Wean O2 as tolerates. CXR shows bilateral pleural effusions L>R with bibasilar atx. 3. Volume Overload - Continue diuresis 4.  Acute blood loss anemia -H/H stable this am is 11.3/34.4 5.HGA1C pre op 6.3. He is pre diabetic. Will stop CBGs and SS. Will need follow up as outpatient. 6.Leukocytosis-WBC decreased from 18.2 to 13.6.Remains afebrile, no signs of wound infection, no GU complaints.Likely a combination of SIRS and atelectasis. 7.LOC for constipation.   ZIMMERMAN,DONIELLE M, PA-C 02/14/2011   I have seen and examined Joel Walters and agree with the above assessment  and plan.  Delight Ovens MD Beeper [phone] Office [phone] 02/14/2011 1:56 PM

## 2011-02-15 MED ORDER — METOPROLOL SUCCINATE ER 25 MG PO TB24: 37.5000 mg | ORAL_TABLET | Freq: Every day | ORAL | Status: DC

## 2011-02-15 MED ORDER — LISINOPRIL 5 MG PO TABS: 5.0000 mg | ORAL_TABLET | Freq: Every day | ORAL | Status: DC

## 2011-02-15 MED ORDER — ASPIRIN 325 MG PO TBEC: 325.0000 mg | DELAYED_RELEASE_TABLET | Freq: Every day | ORAL | Status: AC

## 2011-02-15 MED ORDER — TRAMADOL HCL 50 MG PO TABS: 50.0000 mg | ORAL_TABLET | ORAL | Status: AC | PRN

## 2011-02-15 MED ORDER — METOPROLOL TARTRATE 12.5 MG HALF TABLET
37.5000 mg | ORAL_TABLET | Freq: Two times a day (BID) | ORAL | Status: DC
  Administered 2011-02-15 – 2011-02-16 (×3): 37.5 mg via ORAL
  Filled 2011-02-15 (×5): qty 1

## 2011-02-15 NOTE — Progress Notes (Signed)
EPW's discontinued per MD order and per hospital protocol.  All ends intact.  PT tolerated procedure well.  Pt reminded to stay in bed for one hour.  Chest tube sutures discontinued per MD order and per hospital protocol.  Steri Strips applied.  Will continue to monitor closely
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
## 2011-02-15 NOTE — Progress Notes (Signed)
Patient ambulated 311ft on room air. Patient tolerated well. Joel Walters

## 2011-02-15 NOTE — Progress Notes (Signed)
[facility] DAILY PROGRESS NOTE  NAME:  Joel Walters   MRN: [id number] DOB:  01-06-1951   ADMIT DATE: 02/11/2011   Patient Description   61 y.o. male with recently diagnosed MV CAD - cath 12/19 with CT Sgx consult.  Planned CABG yesterday 12/28.  CABG x 5.   Now POD#4    Subjective:  Walking. Sore - having issues with pain control, but doing much better  Borderline Tachycardia noted on Tele.  Objective:  Temp:  [97 F (36.1 C)-98.1 F (36.7 C)] 98.1 F (36.7 C) (01/01 1357) Pulse Rate:  [90-103] 90  (01/01 1357) Resp:  [19] 19  (01/01 1357) BP: (123-132)/(68-78) 123/78 mmHg (01/01 1357) SpO2:  [88 %-99 %] 99 % (01/01 1357) Weight:  [111 kg (244 lb 11.4 oz)] 244 lb 11.4 oz (111 kg) (01/01 0626) Weight change: -0.131 kg (-4.6 oz)  Intake/Output from previous day: 12/31 0701 - 01/01 0700 In: 480 [P.O.:480] Out: 1151 [Urine:1150; Stool:1]  Intake/Output Summary (Last 24 hours) at 02/15/11 1436 Last data filed at 02/15/11 1200  Gross per 24 hour  Intake    480 ml  Output   1550 ml  Net  -1070 ml    Physical Exam: General appearance: alert, cooperative, appears stated age and no distress Lungs: clear to auscultation bilaterally and non-labored Heart: regular rate and rhythm, S1, S2 normal, no murmur, click, rub or gallop Abdomen: soft, non-tender; bowel sounds normal; no masses,  no organomegaly Neurologic: Grossly normal  Lab Results: CBC    Component Value Date/Time   WBC 13.6* 02/14/2011 0628   RBC 3.80* 02/14/2011 0628   HGB 11.3* 02/14/2011 0628   HCT 34.4* 02/14/2011 0628   PLT 220 02/14/2011 0628   MCV 90.5 02/14/2011 0628   MCH 29.7 02/14/2011 0628   MCHC 32.8 02/14/2011 0628   RDW 13.1 02/14/2011 4098   Treatment Team:  Marykay Lex   Assessment/Plan:  Active Problems:  S/P CABG (coronary artery bypass graft)  HYPERCHOLESTEROLEMIA  HYPERTENSION  PREDIABETES  CAD (coronary artery disease), native coronary  artery - 3 vessel  Leukocytosis - decreasing.  Standard post-op monitoring,  BP stable, on ACE-I & BB Continue statin & ASA & BB-- agree with increased dose for sinus tachycardia; can convert to 75mg  Toprol (Metoprolol Succinate) Was on HCTZ prior to hospitalization for mild edema & HTN - would like to restart prior to d/c once diuresis with Lasix complete  Agree that he is likely ready for d/c tomorrow.  Time Spent Directly with Patient:  10 minutes  Length of Stay:  LOS: 4 days    HARDING,DAVID W, M.D., M.S. [facility] [address] [place], [place]  [address]  [phone]  02/15/2011 2:36 PM

## 2011-02-15 NOTE — Progress Notes (Signed)
Patient ambulated 328ft without difficulty on room air. Patient tolerated walk well but complained of back feeling weak.  Encouraged two more walks before end of day.  Joel Walters
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
## 2011-02-16 ENCOUNTER — Encounter (HOSPITAL_COMMUNITY): Payer: Self-pay | Admitting: Cardiothoracic Surgery

## 2011-02-16 MED ORDER — METOPROLOL TARTRATE 12.5 MG HALF TABLET: 25.0000 mg | ORAL_TABLET | Freq: Three times a day (TID) | ORAL | Status: DC

## 2011-02-16 MED ORDER — METOPROLOL TARTRATE 25 MG PO TABS: 25.0000 mg | ORAL_TABLET | Freq: Three times a day (TID) | ORAL | Status: DC

## 2011-02-16 MED ORDER — FUROSEMIDE 40 MG PO TABS
40.0000 mg | ORAL_TABLET | Freq: Once | ORAL | Status: AC
  Administered 2011-02-16: 40 mg via ORAL
  Filled 2011-02-16: qty 1

## 2011-02-16 MED ORDER — POTASSIUM CHLORIDE CRYS ER 20 MEQ PO TBCR
20.0000 meq | EXTENDED_RELEASE_TABLET | Freq: Once | ORAL | Status: AC
  Administered 2011-02-16: 20 meq via ORAL
  Filled 2011-02-16: qty 1

## 2011-02-16 NOTE — Progress Notes (Signed)
Cardiac Rehab [phone] Education completed with pt and wife. Permission given to refer to [place] Phase 2.  Put on discharge video for viewing. Encouraged watching carbs. Pt to follow up with MD about Hga1c. Shannin Naab DunlapRN

## 2011-02-16 NOTE — Progress Notes (Addendum)
5 Days Post-Op Procedure(s) (LRB): CORONARY ARTERY BYPASS GRAFTING (CABG) (N/A)  Subjective: Patient just finished eating breakfast. Has no complaints.  Objective: Vital signs in last 24 hours: Patient Vitals for the past 24 hrs:  BP Temp Temp src Pulse Resp SpO2 Height Weight  02/16/11 0700 - - - - - - 6\' 2"  (1.88 m) 242 lb 15.2 oz (110.2 kg)  02/16/11 0500 143/88 mmHg 97.5 F (36.4 C) Oral 95  18  94 % - -  02/16/11 0329 - - - - - - - 242 lb 15.2 oz (110.2 kg)  02/15/11 2011 136/73 mmHg - Oral 98  18  93 % - -  02/15/11 1357 123/78 mmHg 98.1 F (36.7 C) Oral 90  19  99 % - -   Pre op weight 109 kg Current Weight  02/16/11 242 lb 15.2 oz (110.2 kg)       Intake/Output from previous day: 01/01 0701 - 01/02 0700 In: 480 [P.O.:480] Out: 1700 [Urine:1700]   Physical Exam:  Cardiovascular: RRR, no murmurs, gallops, or rubs. Pulmonary: Slightly decreased at the bases; no rales, wheezes, or rhonchi. Abdomen: Soft, non tender, some distention,bowel sounds present. Extremities: Bilateral lower extremity edema (R>L) Wounds: Clean and dry.    Lab Results: CBC:  Basename 02/14/11 0628  WBC 13.6*  HGB 11.3*  HCT 34.4*  PLT 220   BMET:   Basename 02/14/11 0628  NA 135  K 3.7  CL 96  CO2 32  GLUCOSE 117*  BUN 24*  CREATININE 0.86  CALCIUM 9.2    PT/INR: No results found for this basename: LABPROT,INR in the last 72 hours ABG:  INR: Will add last result for INR, ABG once components are confirmed Will add last 4 CBG results once components are confirmed  Assessment/Plan:  1. CV - SR.Will continue change Lopressor 25 tid  and Lisinopril  5 daily. 2.  Pulmonary - Encourage incentive spirometer and flutter valve.Has been on room air . 3. Volume Overload - Continue diuresis 4.  Acute blood loss anemia -Last H/H stable at 11.3/34.4 5.HGA1C pre op 6.3.  Will need follow up as outpatient. 6.Leukocytosis-Last WBC down to 13.6.Remains afebrile, no signs of wound  infection, no GU complaints.Likely a combination of SIRS and atelectasis. 7.Discharge today.   Ardelle Balls, PA-C 02/16/2011

## 2011-02-16 NOTE — Progress Notes (Signed)
Ambulated 200 ft on room air,tolerated well.

## 2011-02-16 NOTE — Progress Notes (Signed)
[facility]  Subjective: He had the best nights sleep so far last night.  Objective: Vital signs in last 24 hours: Temp:  [97.5 F (36.4 C)-98.1 F (36.7 C)] 97.5 F (36.4 C) (01/02 0500) Pulse Rate:  [90-98] 95  (01/02 0500) Resp:  [18-19] 18  (01/02 0500) BP: (123-143)/(73-88) 143/88 mmHg (01/02 0500) SpO2:  [93 %-99 %] 94 % (01/02 0500) Weight:  [110.2 kg (242 lb 15.2 oz)] 242 lb 15.2 oz (110.2 kg) (01/02 0700) Last BM Date: 02/15/11  Intake/Output from previous day: 01/01 0701 - 01/02 0700 In: 480 [P.O.:480] Out: 1700 [Urine:1700] Intake/Output this shift:    Medications Current Facility-Administered Medications  Medication Dose Route Frequency Provider Last Rate Last Dose  . 0.9 %  sodium chloride infusion  250 mL Intravenous PRN Delight Ovens, MD      . acetaminophen (TYLENOL) tablet 650 mg  650 mg Oral Q6H PRN Delight Ovens, MD   650 mg at 02/15/11 1617  . aspirin EC tablet 325 mg  325 mg Oral Daily Delight Ovens, MD   325 mg at 02/15/11 1023  . bisacodyl (DULCOLAX) EC tablet 10 mg  10 mg Oral Daily PRN Delight Ovens, MD       Or  . bisacodyl (DULCOLAX) suppository 10 mg  10 mg Rectal Daily PRN Delight Ovens, MD      . docusate sodium (COLACE) capsule 200 mg  200 mg Oral Daily Delight Ovens, MD   200 mg at 02/15/11 1023  . enoxaparin (LOVENOX) injection 40 mg  40 mg Subcutaneous Q24H Delight Ovens, MD   40 mg at 02/15/11 2002  . furosemide (LASIX) tablet 40 mg  40 mg Oral Daily Delight Ovens, MD   40 mg at 02/15/11 1023  . furosemide (LASIX) tablet 40 mg  40 mg Oral Once Ardelle Balls, PA      . lisinopril (PRINIVIL,ZESTRIL) tablet 5 mg  5 mg Oral Daily Ardelle Balls, PA   5 mg at 02/15/11 1023  . metoprolol tartrate (LOPRESSOR) tablet 37.5 mg  37.5 mg Oral BID Ardelle Balls, PA   37.5 mg at 02/15/11 2201  . ondansetron (ZOFRAN) tablet 4 mg  4 mg Oral Q6H PRN Delight Ovens, MD   4  mg at 02/14/11 1610   Or  . ondansetron [facility]) injection 4 mg  4 mg Intravenous Q6H PRN Delight Ovens, MD      . pantoprazole (PROTONIX) EC tablet 40 mg  40 mg Oral QAC breakfast Delight Ovens, MD   40 mg at 02/15/11 9604  . potassium chloride (KLOR-CON) CR tablet 10 mEq  10 mEq Oral Daily Delight Ovens, MD   10 mEq at 02/15/11 1023  . potassium chloride SA (K-DUR,KLOR-CON) CR tablet 20 mEq  20 mEq Oral Once Ardelle Balls, PA      . rosuvastatin (CRESTOR) tablet 10 mg  10 mg Oral q1800 Delight Ovens, MD   10 mg at 02/15/11 1808  . sodium chloride 0.9 % injection 3 mL  3 mL Intravenous Q12H Delight Ovens, MD   3 mL at 02/15/11 2201  . sodium chloride 0.9 % injection 3 mL  3 mL Intravenous PRN Delight Ovens, MD      . traMADol Janean Sark) tablet 50-100 mg  50-100 mg Oral Q4H PRN Delight Ovens, MD   100 mg at 02/15/11 2232  . DISCONTD: metoprolol tartrate (LOPRESSOR)  tablet 25 mg  25 mg Oral BID Delight Ovens, MD   25 mg at 02/14/11 2159    PE: General appearance: alert, cooperative and no distress Lungs: clear to auscultation bilaterally Heart: regular rate and rhythm, S1, S2 normal, no murmur, click, rub or gallop Extremities: 1+ LEE Pulses: 2+ Radials.  Lab Results:   [facility] 02/14/11 0628  WBC 13.6*  HGB 11.3*  HCT 34.4*  PLT 220   BMET  Basename 02/14/11 0628  NA 135  K 3.7  CL 96  CO2 32  GLUCOSE 117*  BUN 24*  CREATININE 0.86  CALCIUM 9.2      Assessment/Plan   Active Problems:  HYPERCHOLESTEROLEMIA  HYPERTENSION  PREDIABETES  CAD (coronary artery disease), native coronary artery - 3 vessel  S/P CABG (coronary artery bypass graft)  Plan:  Post Op Day 5.  CABG x5. Heart rate still elevated.  Diuresing well.  Pt could tolerated increase in BB.  Likely home today.    LOS: 5 days    HAGER,BRYAN W 02/16/2011 7:48 AM  Agree with note written by Jones Skene PAC  POD#5 CABG X 5. Looks great! Exam benign except for mild  BLE edema. Labs OK. For D/C home today per TCTS. ROV with Dr. Herbie Baltimore.  Runell Gess 02/16/2011 9:36 AM

## 2011-02-16 NOTE — Progress Notes (Signed)
Pt. Discharged 02/16/2011  11:18 AM Discharge instructions reviewed with patient/family. Patient/family verbalized understanding. All Rx's given. Questions answered as needed. Pt. Discharged to home with family. Taken off unit via W/C. Nickolas Madrid

## 2011-02-16 NOTE — Progress Notes (Signed)
   CARE MANAGEMENT NOTE 02/16/2011  Patient:  Joel Walters, Joel Walters   Account Number:  [id number]  Date Initiated:  02/14/2011  Documentation initiated by:  [facility]  Subjective/Objective Assessment:   Post op CABG.  Lives at home with wife.     Action/Plan:   PTA, PT INDEPENDENT OF ADLS; SPOUSE TO PROVIDE 24H CARE AT DISCHARGE.   Anticipated DC Date:  02/15/2011   Anticipated DC Plan:  HOME W HOME HEALTH SERVICES      DC Planning Services  CM consult      Choice offered to / List presented to:             Status of service:  Completed, signed off Medicare Important Message given?   (If response is "NO", the following Medicare IM given date fields will be blank) Date Medicare IM given:   Date Additional Medicare IM given:    Discharge Disposition:  HOME/SELF CARE  Per UR Regulation:  Reviewed for med. necessity/level of care/duration of stay  Comments:  02/16/11 Sanvika Cuttino,RN,BSN PT DISCHARGED TO HOME TODAY WITH SPOUSE.  NO HOME HEALTH OR DME NEEDED. Phone #[phone]   02-14-11 8:37am Avie Arenas, RNBSN - [phone] UR Completed.

## 2011-02-21 MED FILL — Sodium Chloride Irrigation Soln 0.9%: Qty: 3000 | Status: AC

## 2011-02-21 MED FILL — Heparin Sodium (Porcine) Inj 1000 Unit/ML: INTRAMUSCULAR | Qty: 30 | Status: AC

## 2011-02-21 MED FILL — Electrolyte-R (PH 7.4) Solution: INTRAVENOUS | Qty: 4000 | Status: AC

## 2011-02-21 MED FILL — Sodium Chloride IV Soln 0.9%: INTRAVENOUS | Qty: 1000 | Status: AC

## 2011-03-07 ENCOUNTER — Ambulatory Visit (INDEPENDENT_AMBULATORY_CARE_PROVIDER_SITE_OTHER): Payer: Self-pay | Admitting: Cardiothoracic Surgery

## 2011-03-07 ENCOUNTER — Encounter: Payer: Self-pay | Admitting: Cardiothoracic Surgery

## 2011-03-07 VITALS — BP 123/76 | HR 81 | Temp 97.4°F | Resp 16 | Ht 73.0 in | Wt 224.0 lb

## 2011-03-07 DIAGNOSIS — Z9889 Other specified postprocedural states: Secondary | ICD-10-CM

## 2011-03-07 NOTE — Progress Notes (Signed)
[address]            [place] [address]          [phone]       Joel Walters [facility] Medical Record #[id number] Date of Birth: 01/29/1951  Noralee Stain, MD Noralee Stain, MD, MD  Chief Complaint:   PostOp Follow Up Visit   History of Present Illness:     Patient comes to the office today in followup after bypass surgery. He was concerned about numbness along the medial aspect of his right lower leg, where the end of vein harvest site was. Otherwise he's done well postoperatively he's had no recurrent angina or evidence of congestive heart failure he's been increasing his activity appropriately. He's had no shortness of breath or chest pain          History  Smoking status  . Never Smoker   Smokeless tobacco  . Never Used       Allergies  Allergen Reactions  . Codeine     REACTION: Jittery, itches    Current Outpatient Prescriptions  Medication Sig Dispense Refill  . hydrochlorothiazide (HYDRODIURIL) 25 MG tablet Take 25 mg by mouth daily.       . metoprolol tartrate (LOPRESSOR) 25 MG tablet Take 1 tablet (25 mg total) by mouth 3 (three) times daily.  90 tablet  1  . Multiple Vitamins-Minerals (MULTIVITAMINS THER. W/MINERALS) TABS Take 1 tablet by mouth daily.       . vitamin C (ASCORBIC ACID) 500 MG tablet Take 500 mg by mouth daily.       Marland Kitchen lisinopril (PRINIVIL,ZESTRIL) 5 MG tablet Take 1 tablet (5 mg total) by mouth daily.  30 tablet  1  . rosuvastatin (CRESTOR) 20 MG tablet Take 1 tablet (20 mg total) by mouth daily at 6 PM.  30 tablet  3       Physical Exam: BP 123/76  Pulse 81  Temp 97.4 F (36.3 C)  Resp 16  Ht 6\' 1"  (1.854 m)  Wt 224 lb (101.606 kg)  BMI 29.55 kg/m2  SpO2 97%  General appearance: alert, cooperative and no distress Neurologic: intact Heart: regular rate and rhythm, S1, S2 normal, no murmur, click, rub or gallop Lungs: clear to auscultation bilaterally Abdomen: soft,  non-tender; bowel sounds normal; no masses,  no organomegaly Extremities: The right leg is without any evidence of infection. There is no tenderness in the calf. The distal end of vein harvest site is without erythema or evidence of infection. He does have paresthesias along the medial aspect of the right leg has full DP and PT pulses bilaterally he has no pedal edema Wounds: His sternal and leg incisions are well-healed.  Diagnostic Studies & Laboratory data:         Recent Radiology Findings: No results found.    Recent Labs: Lab Results  Component Value Date   WBC 13.6* 02/14/2011   HGB 11.3* 02/14/2011   HCT 34.4* 02/14/2011   PLT 220 02/14/2011   GLUCOSE 117* 02/14/2011   CHOL 208* 04/10/2009   TRIG 117.0 04/10/2009   HDL 36.30* 04/10/2009   LDLDIRECT 158.6 04/10/2009   LDLCALC 142* 08/03/2007   ALT 27 02/09/2011   AST 19 02/09/2011   NA 135 02/14/2011   K 3.7 02/14/2011   CL 96 02/14/2011   CREATININE 0.86 02/14/2011   BUN 24* 02/14/2011  CO2 32 02/14/2011   INR 1.34 02/11/2011   HGBA1C 6.3* 02/09/2011      Assessment / Plan:     Patient is concerned about the right lower leg at the site of end of vein harvest. On exam he does not appear to have evidence of venous thrombosis or arterial insufficiency. He does have dysesthesias along the medial aspect of the right lower leg over top the end of vein harvest tunnel. I suspect this is secondary to trauma to the saphenous nerve. Patient was instructed to continue with his ambulation but to keep his legs elevated when resting. He will be seen back in the office again in several days for his routine followup visit with a chest x-ray. He notes erythema fever chills or change in symptoms he will notify us immediately       Delight Ovens MD 03/07/2011 2:51 PM

## 2011-03-09 ENCOUNTER — Other Ambulatory Visit: Payer: Self-pay | Admitting: Cardiothoracic Surgery

## 2011-03-09 ENCOUNTER — Other Ambulatory Visit: Payer: Self-pay | Admitting: *Deleted

## 2011-03-09 DIAGNOSIS — I2510 Atherosclerotic heart disease of native coronary artery without angina pectoris: Secondary | ICD-10-CM

## 2011-03-09 DIAGNOSIS — G8918 Other acute postprocedural pain: Secondary | ICD-10-CM

## 2011-03-09 MED ORDER — TRAMADOL HCL 50 MG PO TABS: 50.0000 mg | ORAL_TABLET | Freq: Four times a day (QID) | ORAL | Status: AC | PRN

## 2011-03-10 ENCOUNTER — Encounter: Payer: Self-pay | Admitting: Cardiothoracic Surgery

## 2011-03-10 ENCOUNTER — Ambulatory Visit
Admission: RE | Admit: 2011-03-10 | Discharge: 2011-03-10 | Disposition: A | Discharge status: Home / Self Care | Payer: PRIVATE HEALTH INSURANCE | Source: Ambulatory Visit | Attending: Cardiothoracic Surgery | Admitting: Cardiothoracic Surgery

## 2011-03-10 ENCOUNTER — Ambulatory Visit (INDEPENDENT_AMBULATORY_CARE_PROVIDER_SITE_OTHER): Payer: Self-pay | Admitting: Cardiothoracic Surgery

## 2011-03-10 VITALS — BP 110/68 | HR 80 | Resp 14 | Ht 73.0 in | Wt 228.0 lb

## 2011-03-10 VITALS — BP 110/68 | HR 80 | Resp 14 | Wt 228.0 lb

## 2011-03-10 DIAGNOSIS — I2510 Atherosclerotic heart disease of native coronary artery without angina pectoris: Secondary | ICD-10-CM

## 2011-03-10 DIAGNOSIS — Z09 Encounter for follow-up examination after completed treatment for conditions other than malignant neoplasm: Secondary | ICD-10-CM

## 2011-03-10 NOTE — Patient Instructions (Signed)
Coronary Artery Bypass Grafting  Care After  Refer to this sheet in the next few weeks. These instructions provide you with information on caring for yourself after your procedure. Your caregiver may also give you more specific instructions. Your treatment has been planned according to current medical practices, but problems sometimes occur. Call your caregiver if you have any problems or questions after your procedure.  Recovery from open heart surgery will be different for everyone. Some people feel well after 3 or 4 weeks, while for others it takes longer. After heart surgery, it may be normal to:  Not have an appetite, feel nauseated by the smell of food, or only want to eat a small amount.   Be constipated because of changes in your diet, activity, and medicines. Eat foods high in fiber. Add fresh fruits and vegetables to your diet. Stool softeners may be helpful.   Feel sad or unhappy. You may be frustrated or cranky. You may have good days and bad days. Do not give up. Talk to your caregiver if you do not feel better.   Feel weakness and fatigue. You many need physical therapy or cardiac rehabilitation to get your strength back.   Develop an irregular heartbeat called atrial fibrillation. Symptoms of atrial fibrillation are a fast, irregular heartbeat or feelings of fluttery heartbeats, shortness of breath, low blood pressure, and dizziness. If these symptoms develop, see your caregiver right away.  MEDICATION  Have a list of all the medicines you will be taking when you leave the hospital. For every medicine, know the following:   Name.   Exact dose.   Time of day to be taken.   How often it should be taken.   Why you are taking it.   Ask which medicines should or should not be taken together. If you take more than one heart medicine, ask if it is okay to take them together. Some heart medicines should not be taken at the same time because they may lower your blood pressure too  much.   Narcotic pain medicine can cause constipation. Eat fresh fruits and vegetables. Add fiber to your diet. Stool softener medicine may help relieve constipation.   Keep a copy of your medicines with you at all times.   Do not add or stop taking any medicine until you check with your caregiver.   Medicines can have side effects. Call your caregiver who prescribed the medicine if you:   Start throwing up, have diarrhea, or have stomach pain.   Feel dizzy or lightheaded when you stand up.   Feel your heart is skipping beats or is beating too fast or too slow.   Develop a rash.   Notice unusual bruising or bleeding.  HOME CARE INSTRUCTIONS  After heart surgery, it is important to learn how to take your pulse. Have your caregiver show you how to take your pulse.   Use your incentive spirometer. Ask your caregiver how long after surgery you need to use it.  Care of your chest incision  Tell your caregiver right away if you notice clicking in your chest (sternum).   Support your chest with a pillow or your arms when you take deep breaths and cough.   Follow your caregiver's instructions about when you can bathe or swim.   Protect your incision from sunlight during the first year to keep the scar from getting dark.   Tell your caregiver if you notice:   Increased tenderness of your incision.  Increased redness or swelling around your incision.   Drainage or pus from your incision.  Care of your leg incision(s)  Avoid crossing your legs.   Avoid sitting for long periods of time. Change positions every half hour.   Elevate your leg(s) when you are sitting.   Check your leg(s) daily for swelling. Check the incisions for redness or drainage.   Diet is very important to heart health.   Eat plenty of fresh fruits and vegetables. Meats should be lean cut. Avoid canned, processed, and fried foods.   Talk to a dietician. They can teach you how to make healthy food and  drink choices.  Weight  Weigh yourself every day. This is important because it helps to know if you are retaining fluid that may make your heart and lungs work harder.   Use the same scale each time.   Weigh yourself every morning at the same time. You should do this after you go to the bathroom, but before you eat breakfast.   Your weight will be more accurate if you do not wear any clothes.   Record your weight.   Tell your caregiver if you have gained 2 pounds or more overnight.  Activity Stop any activity at once if you have chest pain, shortness of breath, irregular heartbeats, or dizziness. Get help right away if you have any of these symptoms.  Bathing.  Avoid soaking in a bath or hot tub until your incisions are healed.   Rest. You need a balance of rest and activity.   Exercise. Exercise per your caregiver's advice. You may need physical therapy or cardiac rehabilitation to help strengthen your muscles and build your endurance.   Climbing stairs. Unless your caregiver tells you not to climb stairs, go up stairs slowly and rest if you tire. Do not pull yourself up by the handrail.   Driving a car. Follow your caregiver's advice on when you may drive. You may ride as a passenger at any time. When traveling for long periods of time in a car, get out of the car and walk around for a few minutes every 2 hours.   Lifting. Avoid lifting, pushing, or pulling anything heavier than 10 pounds for 6 weeks after surgery or as told by your caregiver.   Returning to work. Check with your caregiver. People heal at different rates. Most people will be able to go back to work 6 to 12 weeks after surgery.   Sexual activity. You may resume sexual relations as told by your caregiver.  SEEK MEDICAL CARE IF:  Any of your incisions are red, painful, or have any type of drainage coming from them.   You have an oral temperature above 101.5 F .   You have ankle or leg swelling.   You have pain  in your legs.   You have weight gain of 2 or more pounds a day.   You feel dizzy or lightheaded when you stand up.  SEEK IMMEDIATE MEDICAL CARE IF:  You have angina or chest pain that goes to your jaw or arms. Call your local emergency services right away.   You have shortness of breath at rest or with activity.   You have a fast or irregular heartbeat (arrhythmia).   There is a "clicking" in your sternum when you move.   You have numbness or weakness in your arms or legs.  MAKE SURE YOU:  Understand these instructions.   Will watch your condition.   Will  get help right away if you are not doing well or get worse.

## 2011-03-10 NOTE — Progress Notes (Signed)
[address]            [place] [address]          [phone]       RAPHEAL MASSO [facility] Medical Record #[id number] Date of Birth: May 12, 1950  Marykay Lex, MD Noralee Stain, MD, MD  Chief Complaint:   PostOp Follow Up Visit   History of Present Illness:      SURGICAL PROCEDURE: Coronary artery bypass grafting x5 with the left  internal mammary to the left anterior descending coronary artery,  reverse saphenous vein graft to the diagonal coronary artery, reverse  saphenous vein graft to the obtuse marginal coronary artery, sequential  reverse saphenous vein graft to the acute marginal and posterior  descending coronary artery with right thigh and calf, endo vein  harvesting and left thigh endo vein harvesting. 02/11/2011  Patient returns following recent coronary artery bypass grafting. He was seen earlier in the week with some discomfort along the right vein harvest site this is now completely resolved he comes to the office today very anxious to return to work. He denies any chest pain denies shortness of breath or recurrent angina. He notes that a rash that that he developed has now resolved being off Crestor.       History  Smoking status  . Never Smoker   Smokeless tobacco  . Never Used       Allergies  Allergen Reactions  . Codeine     REACTION: Jittery, itches    Current Outpatient Prescriptions  Medication Sig Dispense Refill  . hydrochlorothiazide (HYDRODIURIL) 25 MG tablet Take 25 mg by mouth daily.       . metoprolol tartrate (LOPRESSOR) 25 MG tablet Take 1 tablet (25 mg total) by mouth 3 (three) times daily.  90 tablet  1  . Multiple Vitamins-Minerals (MULTIVITAMINS THER. W/MINERALS) TABS Take 1 tablet by mouth daily.       . traMADol (ULTRAM) 50 MG tablet Take 1 tablet (50 mg total) by mouth every 6 (six) hours as needed for pain (may take one or two tabs q4-6 hrs prn).  40 tablet  0        Physical Exam: BP 110/68  Pulse 80  Resp 14  Ht 6\' 1"  (1.854 m)  Wt 228 lb (103.42 kg)  BMI 30.08 kg/m2  SpO2 97%  General appearance: alert, cooperative and no distress Neurologic: intact Heart: regular rate and rhythm, S1, S2 normal, no murmur, click, rub or gallop Lungs: clear to auscultation bilaterally Abdomen: soft, non-tender; bowel sounds normal; no masses,  no organomegaly Extremities: extremities normal, atraumatic, no cyanosis or edema Wound: stable sternum Wounds:  Diagnostic Studies & Laboratory data:         Recent Radiology Findings: Dg Chest 2 View  03/10/2011  *RADIOLOGY REPORT*  Clinical Data: Heart surgery December 2012, follow-up  CHEST - 2 VIEW  Comparison: Chest x-ray of 02/14/2011  Findings: Aeration of the lungs has improved with decreasing basilar atelectasis and small effusions.  There does appear to be a tiny left pleural effusion remaining.  Cardiomegaly is stable. Median sternotomy sutures are intact.  No bony abnormality is seen.  IMPRESSION: Improved aeration with only a small left pleural effusion remaining.  Original Report Authenticated By: Juline Patch, M.D.      Recent Labs: Lab Results  Component Value Date   WBC 13.6* 02/14/2011  HGB 11.3* 02/14/2011   HCT 34.4* 02/14/2011   PLT 220 02/14/2011   GLUCOSE 117* 02/14/2011   CHOL 208* 04/10/2009   TRIG 117.0 04/10/2009   HDL 36.30* 04/10/2009   LDLDIRECT 158.6 04/10/2009   LDLCALC 142* 08/03/2007   ALT 27 02/09/2011   AST 19 02/09/2011   NA 135 02/14/2011   K 3.7 02/14/2011   CL 96 02/14/2011   CREATININE 0.86 02/14/2011   BUN 24* 02/14/2011   CO2 32 02/14/2011   INR 1.34 02/11/2011   HGBA1C 6.3* 02/09/2011      Assessment / Plan:      Overall the patient made good progress following coronary artery bypass grafting. He will return to work in February 6 but avoid any lifting over 25 pounds for 3 months. He will contact cardiology to discuss restarting Crestor versus  other statins. He thought possibly the rash was secondary Crestor. He has been watching his weight and has lost down to 228 pounds. He has a followup hemoglobin A1c with his primary care doctor in a couple months.      Delight Ovens MD 03/10/2011 1:40 PM

## 2011-03-11 NOTE — Progress Notes (Signed)
Error

## 2011-04-20 ENCOUNTER — Other Ambulatory Visit: Payer: Self-pay | Admitting: Physician Assistant

## 2011-07-28 ENCOUNTER — Other Ambulatory Visit: Payer: Self-pay | Admitting: Cardiothoracic Surgery

## 2011-09-09 ENCOUNTER — Emergency Department: Payer: Self-pay | Admitting: Emergency Medicine

## 2011-10-14 HISTORY — PX: NM MYOCAR MULTIPLE W/SPECT: HXRAD626

## 2012-01-03 ENCOUNTER — Encounter (HOSPITAL_BASED_OUTPATIENT_CLINIC_OR_DEPARTMENT_OTHER): Payer: Self-pay | Admitting: *Deleted

## 2012-01-03 ENCOUNTER — Other Ambulatory Visit: Payer: Self-pay | Admitting: Orthopedic Surgery

## 2012-01-03 NOTE — Progress Notes (Signed)
Called for all recent notes and tests with dr hardin-pt had cabg 1/13 Working, Will need [employer]

## 2012-01-04 NOTE — H&P (Signed)
Joel Walters is an 61 y.o. male.   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  On July 26th, 2013 he fell with full body weight onto his right hand sustaining a dorsal dislocation of his right small finger PIP joint and a sprain of his right ring finger PIP joint. He was seen at [facility] by a practitioner who attempted to close-reduce his finger without anesthesia initially and subsequently reduced the small finger PIP joint under a digital block.    He was splinted for approximately 7 days and then referred for follow-up therapy.  He was seen at [facility].    He has developed a stiff swan neck deformity of his small finger PIP joint and significant stiffness of the adjacent ring finger PIP joint.  He was referred to see Dr. Izora Ribas, a general surgeon, for a hand surgery consult.  Dr. Izora Ribas advised him to consider a PIP joint release.    He is now referred for an upper extremity orthopaedic consult.    Past Medical History  Diagnosis Date  . Coronary artery disease   . Shortness of breath     exertional  . GERD (gastroesophageal reflux disease)   . Upper GI bleed 1980    PUD/ with blood transfusion  . Prostate infection 2011  . Hypertension     Past Surgical History  Procedure Date  . Nasal sinus surgery 2010  . Cardiac catheterization 02-02-2011  . Irrigation and debridement sebaceous cyst age 62    left breast  . Coronary artery bypass graft 02/11/2011    Procedure: CORONARY ARTERY BYPASS GRAFTING (CABG);  Surgeon: Delight Ovens, MD;  Location: [facility] OR;  Service: Open Heart Surgery;  Laterality: N/A;    Family History  Problem Relation Age of Onset  . Anesthesia problems Neg Hx   . Hypotension Neg Hx   . Malignant hyperthermia Neg Hx   . Pseudochol deficiency Neg Hx    Social  History:  reports that he has never smoked. He has never used smokeless tobacco. He reports that he drinks alcohol. He reports that he does not use illicit drugs.  Allergies:  Allergies  Allergen Reactions  . Codeine     REACTION: Jittery, itches    No prescriptions prior to admission    No results found for this or any previous visit (from the past 48 hour(s)).  No results found.   Pertinent items are noted in HPI.  Height 6\' 1"  (1.854 m), weight 108.863 kg (240 lb).  General appearance: alert Head: Normocephalic, without obvious abnormality Neck: supple, symmetrical, trachea midline Resp: clear to auscultation bilaterally Cardio: regular rate and rhythm GI: normal findings: bowel sounds normal Extremities:   Inspection of his hand reveals an obvious swan neck posture of his right small finger and swelling of his ring finger PIP joint capsule. He rests at a position of hypertension to the small finger PIP joint of 30 degrees with a 30 degree extensor lag at the DIP joint.  He is able to fully extend the ring finger.  He has further flexion of the small finger PIP joint to neutral, further flexion being blocked and painful.  His ring finger range of motion reveals further flexion to 85 degrees. He has full motion of his thumb, index and long fingers.  He has intact sensibility.  Multiple x-ray  views of his ring and small fingers AP and lateral and 30 degree obliques demonstrate that he has a dorsal subluxation of his small finger proximal and phalangeal joint.  There is a bony fragment adjacent to the radial condyle of the proximal phalanx likely a critical corner avulsion sustained at the time of his dislocation.  He has age appropriate degenerative change at all his interphalangeal joints.    He is unable to close his fist tight due to quadriga, i.e. his stiff small finger is markedly limiting the ability to fully flex the ring finger and slightly limiting the ability to fully flex the  long finger.    Pulses: 2+ and symmetric Skin: normal Neurologic: Grossly normal    Assessment/Plan ASSESSMENT:    Stiff swan neck posture right small finger, three months status post dorsal dislocation.    Plan:There is a moderate chance that this small finger can be salvaged by an appropriate capsulectomy, collateral ligament release and creation of a checkrein ligament on the volar aspect of the PIP joint. If the joint surfaces are significantly damaged, a fusion of the PIP joint will be accomplished.The procedure, risks,benefits and post-op course were discussed with the patient at length and they were in agreement with the plan.      DASNOIT,Erving Sassano J 01/04/2012, 5:37 PM  H&P documentation: 01/05/2012  -History and Physical Reviewed  -Patient has been re-examined  -No change in the plan of care  Wyn Forster, MD

## 2012-01-05 ENCOUNTER — Encounter (HOSPITAL_BASED_OUTPATIENT_CLINIC_OR_DEPARTMENT_OTHER): Payer: Self-pay | Admitting: Certified Registered Nurse Anesthetist

## 2012-01-05 ENCOUNTER — Encounter (HOSPITAL_BASED_OUTPATIENT_CLINIC_OR_DEPARTMENT_OTHER): Payer: Self-pay | Admitting: *Deleted

## 2012-01-05 ENCOUNTER — Ambulatory Visit (HOSPITAL_BASED_OUTPATIENT_CLINIC_OR_DEPARTMENT_OTHER): Payer: Worker's Compensation | Admitting: Certified Registered Nurse Anesthetist

## 2012-01-05 ENCOUNTER — Encounter (HOSPITAL_BASED_OUTPATIENT_CLINIC_OR_DEPARTMENT_OTHER)
Admission: RE | Disposition: A | Discharge status: Home / Self Care | Payer: Self-pay | Source: Ambulatory Visit | Attending: Orthopedic Surgery

## 2012-01-05 ENCOUNTER — Ambulatory Visit (HOSPITAL_BASED_OUTPATIENT_CLINIC_OR_DEPARTMENT_OTHER)
Admission: RE | Admit: 2012-01-05 | Discharge: 2012-01-05 | Disposition: A | Discharge status: Home / Self Care | Payer: Worker's Compensation | Source: Ambulatory Visit | Attending: Orthopedic Surgery | Admitting: Orthopedic Surgery

## 2012-01-05 DIAGNOSIS — M24549 Contracture, unspecified hand: Secondary | ICD-10-CM | POA: Insufficient documentation

## 2012-01-05 DIAGNOSIS — I2510 Atherosclerotic heart disease of native coronary artery without angina pectoris: Secondary | ICD-10-CM | POA: Insufficient documentation

## 2012-01-05 DIAGNOSIS — M20039 Swan-neck deformity of unspecified finger(s): Secondary | ICD-10-CM | POA: Insufficient documentation

## 2012-01-05 DIAGNOSIS — K219 Gastro-esophageal reflux disease without esophagitis: Secondary | ICD-10-CM | POA: Insufficient documentation

## 2012-01-05 DIAGNOSIS — X58XXXS Exposure to other specified factors, sequela: Secondary | ICD-10-CM | POA: Insufficient documentation

## 2012-01-05 DIAGNOSIS — M24649 Ankylosis, unspecified hand: Secondary | ICD-10-CM | POA: Insufficient documentation

## 2012-01-05 DIAGNOSIS — IMO0002 Reserved for concepts with insufficient information to code with codable children: Secondary | ICD-10-CM | POA: Insufficient documentation

## 2012-01-05 DIAGNOSIS — M6789 Other specified disorders of synovium and tendon, multiple sites: Secondary | ICD-10-CM | POA: Insufficient documentation

## 2012-01-05 DIAGNOSIS — Z885 Allergy status to narcotic agent status: Secondary | ICD-10-CM | POA: Insufficient documentation

## 2012-01-05 DIAGNOSIS — Z951 Presence of aortocoronary bypass graft: Secondary | ICD-10-CM | POA: Insufficient documentation

## 2012-01-05 DIAGNOSIS — I10 Essential (primary) hypertension: Secondary | ICD-10-CM | POA: Insufficient documentation

## 2012-01-05 HISTORY — DX: Essential (primary) hypertension: I10

## 2012-01-05 HISTORY — PX: CAPSULAR RELEASE: SHX6293

## 2012-01-05 LAB — POCT I-STAT, CHEM 8
BUN: 24 mg/dL — ABNORMAL HIGH (ref 6–23)
Creatinine, Ser: 1 mg/dL (ref 0.50–1.35)
Glucose, Bld: 115 mg/dL — ABNORMAL HIGH (ref 70–99)
Hemoglobin: 16 g/dL (ref 13.0–17.0)
TCO2: 24 mmol/L (ref 0–100)

## 2012-01-05 SURGERY — CAPSULAR RELEASE
Anesthesia: General | Site: Finger | Laterality: Right | Wound class: Clean

## 2012-01-05 MED ORDER — LIDOCAINE HCL 2 % IJ SOLN
INTRAMUSCULAR | Status: DC | PRN
  Administered 2012-01-05: 5 mL

## 2012-01-05 MED ORDER — OXYCODONE HCL 5 MG PO TABS: 5.0000 mg | ORAL_TABLET | Freq: Once | ORAL | Status: DC | PRN

## 2012-01-05 MED ORDER — FENTANYL CITRATE 0.05 MG/ML IJ SOLN
INTRAMUSCULAR | Status: DC | PRN
  Administered 2012-01-05 (×2): 25 ug via INTRAVENOUS
  Administered 2012-01-05: 50 ug via INTRAVENOUS
  Administered 2012-01-05: 25 ug via INTRAVENOUS

## 2012-01-05 MED ORDER — CEPHALEXIN 500 MG PO CAPS: 500.0000 mg | ORAL_CAPSULE | Freq: Three times a day (TID) | ORAL | Status: DC

## 2012-01-05 MED ORDER — DEXAMETHASONE SODIUM PHOSPHATE 10 MG/ML IJ SOLN
INTRAMUSCULAR | Status: DC | PRN
  Administered 2012-01-05: 10 mg via INTRAVENOUS

## 2012-01-05 MED ORDER — MIDAZOLAM HCL 5 MG/5ML IJ SOLN
INTRAMUSCULAR | Status: DC | PRN
  Administered 2012-01-05: 1 mg via INTRAVENOUS

## 2012-01-05 MED ORDER — LACTATED RINGERS IV SOLN
INTRAVENOUS | Status: DC
  Administered 2012-01-05 (×2): via INTRAVENOUS

## 2012-01-05 MED ORDER — LIDOCAINE HCL (CARDIAC) 20 MG/ML IV SOLN
INTRAVENOUS | Status: DC | PRN
  Administered 2012-01-05: 60 mg via INTRAVENOUS

## 2012-01-05 MED ORDER — ONDANSETRON HCL 4 MG/2ML IJ SOLN: 4.0000 mg | Freq: Four times a day (QID) | INTRAMUSCULAR | Status: DC | PRN

## 2012-01-05 MED ORDER — FENTANYL CITRATE 0.05 MG/ML IJ SOLN
25.0000 ug | INTRAMUSCULAR | Status: DC | PRN
  Administered 2012-01-05 (×2): 50 ug via INTRAVENOUS

## 2012-01-05 MED ORDER — OXYCODONE HCL 5 MG/5ML PO SOLN: 5.0000 mg | Freq: Once | ORAL | Status: DC | PRN

## 2012-01-05 MED ORDER — HYDROMORPHONE HCL 2 MG PO TABS: ORAL_TABLET | ORAL | Status: DC

## 2012-01-05 MED ORDER — CHLORHEXIDINE GLUCONATE 4 % EX LIQD: 60.0000 mL | Freq: Once | CUTANEOUS | Status: DC

## 2012-01-05 MED ORDER — CEFAZOLIN SODIUM-DEXTROSE 2-3 GM-% IV SOLR
2.0000 g | Freq: Once | INTRAVENOUS | Status: AC
  Administered 2012-01-05: 2 g via INTRAVENOUS

## 2012-01-05 MED ORDER — PROPOFOL 10 MG/ML IV BOLUS
INTRAVENOUS | Status: DC | PRN
  Administered 2012-01-05: 200 mg via INTRAVENOUS

## 2012-01-05 MED ORDER — ONDANSETRON HCL 4 MG/2ML IJ SOLN
INTRAMUSCULAR | Status: DC | PRN
  Administered 2012-01-05: 4 mg via INTRAVENOUS

## 2012-01-05 MED ORDER — EPHEDRINE SULFATE 50 MG/ML IJ SOLN
INTRAMUSCULAR | Status: DC | PRN
  Administered 2012-01-05 (×2): 10 mg via INTRAVENOUS

## 2012-01-05 MED ORDER — HYDROMORPHONE HCL 2 MG PO TABS
2.0000 mg | ORAL_TABLET | Freq: Four times a day (QID) | ORAL | Status: DC | PRN
  Administered 2012-01-05: 2 mg via ORAL

## 2012-01-05 SURGICAL SUPPLY — 60 items
BANDAGE ADHESIVE 1X3 (GAUZE/BANDAGES/DRESSINGS) IMPLANT
BANDAGE ELASTIC 3 VELCRO ST LF (GAUZE/BANDAGES/DRESSINGS) ×3 IMPLANT
BANDAGE ELASTIC 4 VELCRO ST LF (GAUZE/BANDAGES/DRESSINGS) IMPLANT
BANDAGE GAUZE ELAST BULKY 4 IN (GAUZE/BANDAGES/DRESSINGS) ×3 IMPLANT
BLADE MINI RND TIP GREEN BEAV (BLADE) ×3 IMPLANT
BLADE SURG 15 STRL LF DISP TIS (BLADE) ×2 IMPLANT
BLADE SURG 15 STRL SS (BLADE) ×1
BNDG COHESIVE 1X5 TAN STRL LF (GAUZE/BANDAGES/DRESSINGS) IMPLANT
BNDG ELASTIC 2 VLCR STRL LF (GAUZE/BANDAGES/DRESSINGS) ×3 IMPLANT
BNDG ESMARK 4X9 LF (GAUZE/BANDAGES/DRESSINGS) ×3 IMPLANT
BRUSH SCRUB EZ PLAIN DRY (MISCELLANEOUS) ×3 IMPLANT
CANISTER SUCTION 1200CC (MISCELLANEOUS) ×3 IMPLANT
CLOTH BEACON ORANGE TIMEOUT ST (SAFETY) ×3 IMPLANT
CORDS BIPOLAR (ELECTRODE) ×3 IMPLANT
COVER MAYO STAND STRL (DRAPES) ×3 IMPLANT
COVER TABLE BACK 60X90 (DRAPES) ×3 IMPLANT
CUFF TOURNIQUET SINGLE 18IN (TOURNIQUET CUFF) ×3 IMPLANT
DECANTER SPIKE VIAL GLASS SM (MISCELLANEOUS) IMPLANT
DRAPE EXTREMITY T 121X128X90 (DRAPE) ×3 IMPLANT
DRAPE OEC MINIVIEW 54X84 (DRAPES) ×3 IMPLANT
DRAPE SURG 17X23 STRL (DRAPES) ×3 IMPLANT
GLOVE BIO SURGEON STRL SZ 6.5 (GLOVE) ×3 IMPLANT
GLOVE BIOGEL M STRL SZ7.5 (GLOVE) ×3 IMPLANT
GLOVE BIOGEL PI IND STRL 7.0 (GLOVE) ×2 IMPLANT
GLOVE BIOGEL PI INDICATOR 7.0 (GLOVE) ×1
GLOVE ORTHO TXT STRL SZ7.5 (GLOVE) ×3 IMPLANT
GOWN PREVENTION PLUS XLARGE (GOWN DISPOSABLE) ×3 IMPLANT
GOWN PREVENTION PLUS XXLARGE (GOWN DISPOSABLE) ×6 IMPLANT
NEEDLE 27GAX1X1/2 (NEEDLE) ×3 IMPLANT
NS IRRIG 1000ML POUR BTL (IV SOLUTION) ×3 IMPLANT
PACK BASIN DAY SURGERY FS (CUSTOM PROCEDURE TRAY) ×3 IMPLANT
PAD CAST 3X4 CTTN HI CHSV (CAST SUPPLIES) ×2 IMPLANT
PAD CAST 4YDX4 CTTN HI CHSV (CAST SUPPLIES) IMPLANT
PADDING CAST ABS 4INX4YD NS (CAST SUPPLIES) ×1
PADDING CAST ABS COTTON 4X4 ST (CAST SUPPLIES) ×2 IMPLANT
PADDING CAST COTTON 3X4 STRL (CAST SUPPLIES) ×1
PADDING CAST COTTON 4X4 STRL (CAST SUPPLIES)
PADDING UNDERCAST 2  STERILE (CAST SUPPLIES) ×3 IMPLANT
SLEEVE SCD COMPRESS KNEE MED (MISCELLANEOUS) ×3 IMPLANT
SPLINT PLASTER CAST XFAST 3X15 (CAST SUPPLIES) ×30 IMPLANT
SPLINT PLASTER XTRA FASTSET 3X (CAST SUPPLIES) ×15
SPONGE GAUZE 4X4 12PLY (GAUZE/BANDAGES/DRESSINGS) ×3 IMPLANT
STOCKINETTE 4X48 STRL (DRAPES) ×3 IMPLANT
STRIP CLOSURE SKIN 1/2X4 (GAUZE/BANDAGES/DRESSINGS) ×3 IMPLANT
SUCTION FRAZIER TIP 10 FR DISP (SUCTIONS) IMPLANT
SUT ETHILON 4 0 PS 2 18 (SUTURE) IMPLANT
SUT MERSILENE 4 0 P 3 (SUTURE) ×3 IMPLANT
SUT PROLENE 3 0 PS 2 (SUTURE) ×3 IMPLANT
SUT PROLENE 4 0 P 3 18 (SUTURE) ×3 IMPLANT
SUT SILK 4 0 PS 2 (SUTURE) ×3 IMPLANT
SUT VIC AB 4-0 P-3 18XBRD (SUTURE) IMPLANT
SUT VIC AB 4-0 P3 18 (SUTURE)
SYR 3ML 23GX1 SAFETY (SYRINGE) IMPLANT
SYR BULB 3OZ (MISCELLANEOUS) ×3 IMPLANT
SYR CONTROL 10ML LL (SYRINGE) ×3 IMPLANT
TOWEL OR 17X24 6PK STRL BLUE (TOWEL DISPOSABLE) ×6 IMPLANT
TRAY DSU PREP LF (CUSTOM PROCEDURE TRAY) ×3 IMPLANT
TUBE CONNECTING 20X1/4 (TUBING) IMPLANT
UNDERPAD 30X30 INCONTINENT (UNDERPADS AND DIAPERS) ×3 IMPLANT
WATER STERILE IRR 1000ML POUR (IV SOLUTION) ×3 IMPLANT

## 2012-01-05 NOTE — Brief Op Note (Signed)
01/05/2012  11:15 AM  PATIENT:  Mickie Bail  61 y.o. male  PRE-OPERATIVE DIAGNOSIS:  STIFF SWAN NECK RIGHT SMALL FINGER  POST-OPERATIVE DIAGNOSIS:  stiff swan neck right small finger, volar plate rupture severe tenodesis of flexors, extensors and pip capsule contracture  PROCEDURE:  Procedure(s) (LRB) with comments: CAPSULAR RELEASE (Right) - PIP CAPSULE RELEASE, FDS(FLEXOR DIGITORIUM SUPERFICIALIS) TENODESIS,  RIGHT SMALL RING FINGER,volar check rain ligament  SURGEON:  Surgeon(s) and Role:    * Wyn Forster., MD - Primary  PHYSICIAN ASSISTANT:   ASSISTANTS: Mallory Shirk.A-C   ANESTHESIA:   general  EBL:  Total I/O In: 1300 [I.V.:1300] Out: -   BLOOD ADMINISTERED:none  DRAINS: none   LOCAL MEDICATIONS USED:  LIDOCAINE   SPECIMEN:  No Specimen  DISPOSITION OF SPECIMEN:  N/A  COUNTS:  YES  TOURNIQUET:   Total Tourniquet Time Documented: Upper Arm (Right) - 71 minutes  DICTATION:[id number]  PLAN OF CARE: Discharge to home after PACU  PATIENT DISPOSITION:  PACU - hemodynamically stable.

## 2012-01-05 NOTE — Op Note (Signed)
[id number] 

## 2012-01-05 NOTE — Anesthesia Procedure Notes (Signed)
Procedure Name: LMA Insertion Date/Time: 01/05/2012 9:48 AM Performed by: Derril Franek D Pre-anesthesia Checklist: Patient identified, Emergency Drugs available, Suction available and Patient being monitored Patient Re-evaluated:Patient Re-evaluated prior to inductionOxygen Delivery Method: Circle System Utilized Preoxygenation: Pre-oxygenation with 100% oxygen Intubation Type: IV induction Ventilation: Mask ventilation without difficulty LMA: LMA inserted LMA Size: 5.0 Number of attempts: 1 Placement Confirmation: positive ETCO2 Tube secured with: Tape Dental Injury: Teeth and Oropharynx as per pre-operative assessment

## 2012-01-05 NOTE — Transfer of Care (Signed)
Immediate Anesthesia Transfer of Care Note  Patient: Joel Walters  Procedure(s) Performed: Procedure(s) (LRB) with comments: CAPSULAR RELEASE (Right) - PIP CAPSULE RELEASE, FDS(FLEXOR DIGITORIUM SUPERFICIALIS) TENODESIS,  RIGHT SMALL RING FINGER,volar check rain ligament  Patient Location: PACU  Anesthesia Type:General  Level of Consciousness: awake and patient cooperative  Airway & Oxygen Therapy: Patient Spontanous Breathing and Patient connected to face mask oxygen  Post-op Assessment: Report given to PACU RN and Post -op Vital signs reviewed and stable  Post vital signs: Reviewed and stable  Complications: No apparent anesthesia complications

## 2012-01-05 NOTE — Anesthesia Preprocedure Evaluation (Signed)
Anesthesia Evaluation  Patient identified by MRN, date of birth, ID band Patient awake    Reviewed: Allergy & Precautions, H&P , NPO status , Patient's Chart, lab work & pertinent test results  Airway Mallampati: II  Neck ROM: full    Dental   Pulmonary shortness of breath,          Cardiovascular hypertension, + CAD     Neuro/Psych Depression    GI/Hepatic PUD, GERD-  ,  Endo/Other    Renal/GU      Musculoskeletal   Abdominal   Peds  Hematology   Anesthesia Other Findings   Reproductive/Obstetrics                           Anesthesia Physical Anesthesia Plan  ASA: III  Anesthesia Plan: General   Post-op Pain Management:    Induction: Intravenous  Airway Management Planned: LMA  Additional Equipment:   Intra-op Plan:   Post-operative Plan:   Informed Consent: I have reviewed the patients History and Physical, chart, labs and discussed the procedure including the risks, benefits and alternatives for the proposed anesthesia with the patient or authorized representative who has indicated his/her understanding and acceptance.     Plan Discussed with: CRNA and Surgeon  Anesthesia Plan Comments:         Anesthesia Quick Evaluation

## 2012-01-05 NOTE — Anesthesia Postprocedure Evaluation (Signed)
Anesthesia Post Note  Patient: Joel Walters  Procedure(s) Performed: Procedure(s) (LRB): CAPSULAR RELEASE (Right)  Anesthesia type: General  Patient location: PACU  Post pain: Pain level controlled and Adequate analgesia  Post assessment: Post-op Vital signs reviewed, Patient's Cardiovascular Status Stable, Respiratory Function Stable, Patent Airway and Pain level controlled  Last Vitals:  Filed Vitals:   01/05/12 1123  BP:   Pulse: 80  Temp:   Resp: 13    Post vital signs: Reviewed and stable  Level of consciousness: awake, alert  and oriented  Complications: No apparent anesthesia complications

## 2012-01-06 ENCOUNTER — Encounter (HOSPITAL_BASED_OUTPATIENT_CLINIC_OR_DEPARTMENT_OTHER): Payer: Self-pay | Admitting: Orthopedic Surgery

## 2012-01-06 NOTE — Op Note (Signed)
Joel Walters, Joel Walters                 ACCOUNT NO.:  [id number]  MEDICAL RECORD NO.:  [id number]  LOCATION:                                 FACILITY:  PHYSICIAN:  Katy Fitch. Analiyah Lechuga, M.D. 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  POSTOPERATIVE DIAGNOSES: 1. Severe flexor tenodesis, right small finger. 2. Severe extensor tenodesis, right small finger. 3. Proximal interphalangeal capsule contracture dorsally including     radial, ulnar proper collateral ligaments and radial and ulnar     accessory collateral ligaments with rupture of volar plate.  OPERATION: 1. Volar and dorsal capsulectomy of right small finger proximal     interphalangeal joint. 2. Extensor tenolysis including extensor digitorum longus, extensor     digiti minimi, and intrinsics. 3. Flexor digitorum superficialis and flexor digitorum profundus     tenolysis, right small finger. 4. Radial slip of flexor digitorum superficialis Checkrein ligament     with repair to proximal A2 pulley to prevent hyperextension     deformity of proximal interphalangeal joint.  OPERATING SURGEON:  Katy Fitch. Corey Caulfield, MD.  ASSISTANT:  Marveen Reeks. Dasnoit, PA-C.  ANESTHESIA:  General by LMA.  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  On September 09, 2011, he sustained a complex dorsal dislocation of his right small finger PIP joint.  He was seen at an urgent care facility where they attempted closed reduction without anesthesia.  This was unsuccessful.  He then had a digital block placed followed by attempted closed reduction that was reported to be successful.  He then was  placed in a splint and sent to therapy.  He was in therapy for a very short period of time and sent back to work.  He immediately developed a chronic swan- neck posture of his finger with hyperextension of the PIP joint more than 20 degrees and a 40-degree extensor lag at the DIP joint.  He went on to develop stiffness of the ring finger as well due to quadriga. Ultimately, he was referred for Hand Surgery consult with Dr. Izora Ribas. Dr. Izora Ribas recommended a soft tissue procedure to the finger.  He subsequently sought an alternative orthopaedic Hand consult and was advised to consider capsulectomy, tenolysis, and a tendon graft checkrein ligament reconstruction to prevent recurrent dislocation dorsally of the PIP joint.  Mr. Dempster contemplated the multiple recommendations he received and ultimately returned to [facility] for  definitive treatment of his finger.  He is scheduled for surgery at this time.  Preoperatively, he was advised that if we found significant degenerative arthritis of PIP joint, we would proceed with an arthrodesis of the PIP joint.  PROCEDURE:  Nickolaos Brallier was brought to room 1 of the [facility] and placed in a supine position upon the operating table.  Following anesthesia informed consent by Dr. Chaney Malling, general anesthesia by LMA technique was recommended  and accepted.  In room 1 under Dr. Seward Meth direct supervision, general anesthesia by LMA technique was induced followed by Betadine scrub and paint of the right upper extremity.  3 g of Ancef were administered as an IV prophylactic antibiotic.  Following exsanguination of the right arm with Esmarch bandage, arterial tourniquet was inflated 220 mmHg.  Following routine surgical time-out, the procedure commenced with a complex Brunner zigzag incision extending from the A4 pulley distally to the A1 pulley proximally.  Subcutaneous tissues were carefully divided revealing  abundant fibrosis due to chronic swelling, stiffness, and scarring following dislocation.  The radial and ulnar neurovascular bundles were identified and gently retracted.  The flexor sheath was carefully cleared from the scar.  A flap was created incorporating the C1 and A3 pulleys exposing the flexor tendons.  There were dense adhesions to the profundus and superficialis tendons to the proximal phalanx and middle phalanx.  The volar plate was ruptured distally.  The collateral ligaments were scarred and trapped dorsally.  We performed a meticulous volar plate resection followed by meticulous resection of the collateral ligaments radially and ulnarly.  The superficialis and profundus tendons were tenolysed from the A1 pulley distally to the A4 pulley.  We could not reduce the joint due to dense scar dorsally therefore the finger was turned over and a longitudinal incision was fashioned centered over the PIP joint.  Meticulous tenolysis of the extensor and intrinsic tendons was accomplished followed by resection of the entire dorsal capsule and complete release of the radial ulnar collateral ligaments circumferentially.  Following this maneuver, we could reduce the PIP joint and flex it 100 degrees. The DIP lag persisted due to adherence of the lateral bands of the middle phalanx.  With great care, the lateral bands were released with scissors and Freer dissection.  We then reduced the joint and closed the dorsal incision with an intradermal 4-0 Prolene suture.  Attention was then directed to the volar aspect of the PIP joint.  While holding the joint in 10-degree reduced position, we performed a radial superficialis release at the A1 pulley level.  The slip was brought distally and split at the decussation.  The radial slip of the superficialis was then woven through the proximal 3rd of the A2 pulley with a curved tendon braider and then secured to itself and the assembly line of  the proximal phalanx with multiple interrupted sutures of 4-0 Mersilene.  The remaining ulnar slip of the superficialis was left intact and the C1 and A3 pulley flap was repaired anatomically with multiple mattress sutures of 4-0 Mersilene.  With traction on the profundus tendon proximal to the A1 pulley, flexion of the finger nearly bringing the tip of the finger to the distal palmar crease was noted.  The wounds were then repaired with interrupted corner sutures of 4-0 Prolene followed by intradermal 3 and 4-0 Prolene.  Mr. Cullers was placed in a voluminous hand dressing with his wrist in 40 degrees of dorsiflexion and his PIP joint block to 10 degree flexion.  We will initiate immediate active flexion exercises.  He is anesthetized with 2% lidocaine at metacarpal head level postop analgesia.  He is provided prescriptions for Dilaudid 2 mg 1-2 tablets p.o. q.4-6 hours p.r.n. pain, 30 tablets without refill, also Keflex 500 mg 1 p.o. q.8 hours x4 days as a prophylactic antibiotic.     Katy Fitch Maijor Hornig, M.D.     RVS/MEDQ  D:  01/05/2012  T:  01/06/2012  Job:  [id number]

## 2012-01-09 ENCOUNTER — Encounter (HOSPITAL_BASED_OUTPATIENT_CLINIC_OR_DEPARTMENT_OTHER): Payer: Self-pay

## 2012-11-05 ENCOUNTER — Emergency Department: Payer: Self-pay | Admitting: Emergency Medicine

## 2012-12-20 ENCOUNTER — Other Ambulatory Visit: Payer: Self-pay

## 2013-01-23 ENCOUNTER — Encounter: Payer: Self-pay | Admitting: *Deleted

## 2013-01-24 ENCOUNTER — Ambulatory Visit (INDEPENDENT_AMBULATORY_CARE_PROVIDER_SITE_OTHER): Payer: PRIVATE HEALTH INSURANCE | Admitting: Cardiology

## 2013-01-24 ENCOUNTER — Encounter: Payer: Self-pay | Admitting: Cardiology

## 2013-01-24 VITALS — BP 122/82 | HR 67 | Ht 73.0 in | Wt 248.1 lb

## 2013-01-24 DIAGNOSIS — I2510 Atherosclerotic heart disease of native coronary artery without angina pectoris: Secondary | ICD-10-CM

## 2013-01-24 DIAGNOSIS — E7800 Pure hypercholesterolemia, unspecified: Secondary | ICD-10-CM

## 2013-01-24 DIAGNOSIS — Z951 Presence of aortocoronary bypass graft: Secondary | ICD-10-CM

## 2013-01-24 DIAGNOSIS — I10 Essential (primary) hypertension: Secondary | ICD-10-CM

## 2013-01-24 DIAGNOSIS — E669 Obesity, unspecified: Secondary | ICD-10-CM

## 2013-01-24 NOTE — Patient Instructions (Addendum)
Sorry to hear of your loss.  I know that things have not been easy & that is understandable.  Thankfully, your heart seems to be doing well physically.   Heart Rate & Blood pressure are great.  You have put on weight - not unexpected.  You can get the weight off gradually with continued exercise & cutting back the diet.  Please ask your PCP to send [place] a copy of your blood work for cholesterol levels.   Marykay Lex, MD
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
## 2013-01-25 ENCOUNTER — Encounter: Payer: Self-pay | Admitting: Cardiology

## 2013-01-26 ENCOUNTER — Encounter: Payer: Self-pay | Admitting: Cardiology

## 2013-01-26 DIAGNOSIS — E669 Obesity, unspecified: Secondary | ICD-10-CM | POA: Insufficient documentation

## 2013-01-26 DIAGNOSIS — I10 Essential (primary) hypertension: Secondary | ICD-10-CM | POA: Insufficient documentation

## 2013-01-26 NOTE — Assessment & Plan Note (Signed)
Very stable from a cardiac standpoint no major complaints or problems. He has had a a followup Myoview was negative with excellent exercise tolerance. Besides affect is unable to a statin he is on a beta blocker and an ARB doing well. He is on aspirin.

## 2013-01-26 NOTE — Assessment & Plan Note (Signed)
Well-controlled on low-dose ARB, beta blocker plus HCTZ. I think the HCTZ was added due to some edema in the past. Since everything is stable, I will not make any changes.

## 2013-01-26 NOTE — Assessment & Plan Note (Signed)
His followup stress test in August 2013 was with great results. Would potentially consider another stress test sometime in 2017 to evaluate for graft patency unless his symptoms in the interim.

## 2013-01-26 NOTE — Assessment & Plan Note (Signed)
He has not had labs checked in a while quite some can tell. He is due to get some labs checked by his PCP soon. I have asked that they have been sent to [place]. With his statin intolerance, his use Crestor and Lipitor. The only other active process and that medication will be Livalo which may be difficult with his current employment status. I would consider fenofibrate at or some other medication, as I'm not sure how well-controlled his labs are.

## 2013-01-26 NOTE — Assessment & Plan Note (Signed)
Is little distressed to see the weight back on. This is most all the weight he lost his back on again. Simply just by getting out of the habit of doing his diet and exercise. He certainly had a good excuse or reason, but now his sort of "coming out of his shell "hopefully he can get back into a good rhythm with diet and exercise and loses weight back.

## 2013-04-26 ENCOUNTER — Other Ambulatory Visit: Payer: Self-pay | Admitting: Cardiology

## 2013-04-26 NOTE — Telephone Encounter (Signed)
Rx was sent to pharmacy electronically. 

## 2013-08-22 ENCOUNTER — Telehealth: Payer: Self-pay | Admitting: Cardiology

## 2013-08-22 NOTE — Telephone Encounter (Signed)
Returned call to [employer] with [facility].Stated she was trying to help patient with his medications.Stated she wanted to know if patient can take aspirin 81 mg 1 tablet daily or 325 mg 1 tablet daily.Message sent to [place] for advice.

## 2013-08-22 NOTE — Telephone Encounter (Signed)
81 

## 2013-08-22 NOTE — Telephone Encounter (Signed)
Wanting to get correct dosage on aspirin  Please call

## 2013-08-22 NOTE — Telephone Encounter (Signed)
Returned call to [place] advised aspirin 81 mg 1 tablet daily.

## 2013-11-24 IMAGING — CR DG CHEST 2V
2 series · 2 of 2 positions shown · non-contrast
Comparison: None.

CLINICAL DATA: Precardiac catheterization.

CHEST - 2 VIEW

[w chest pa]
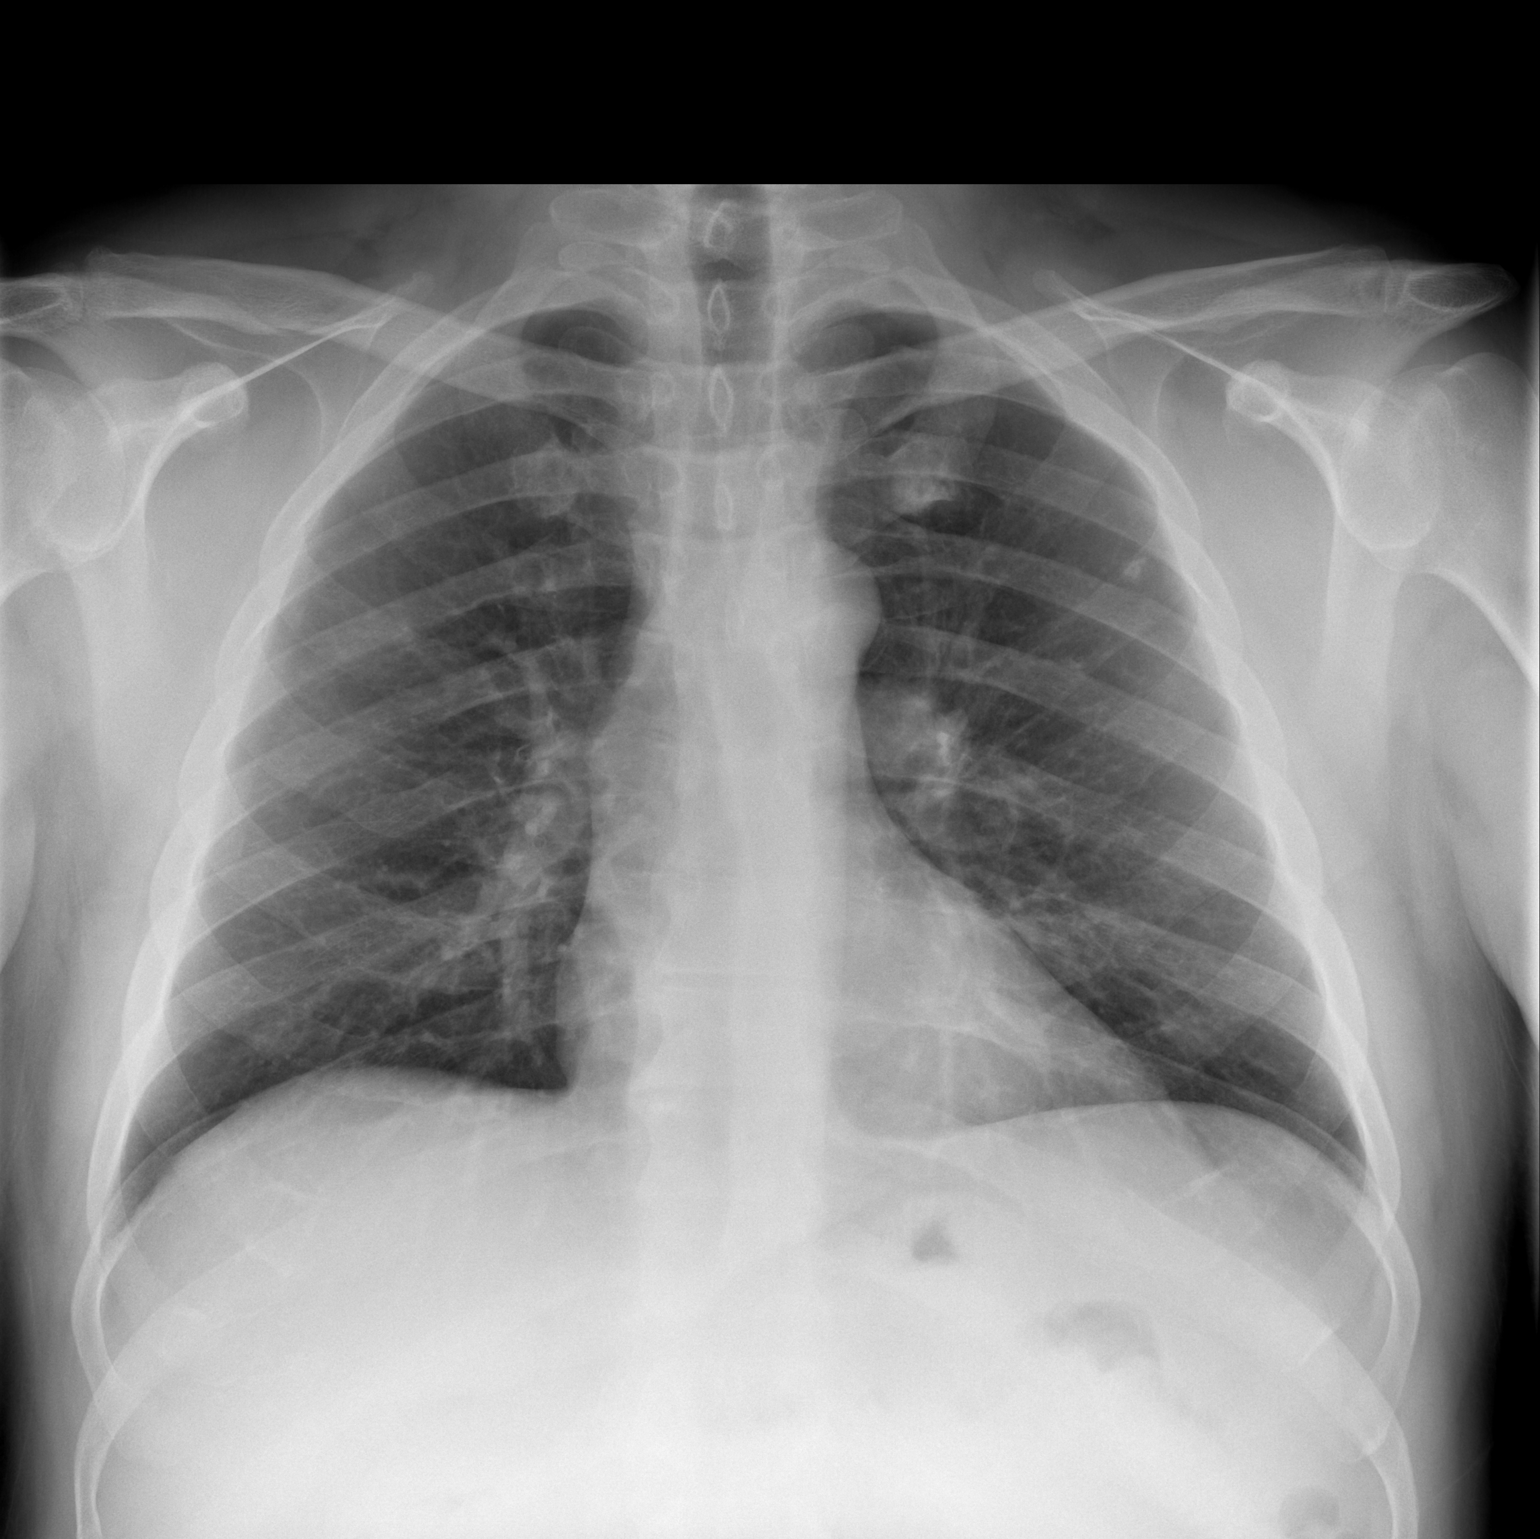

[w chest lat]
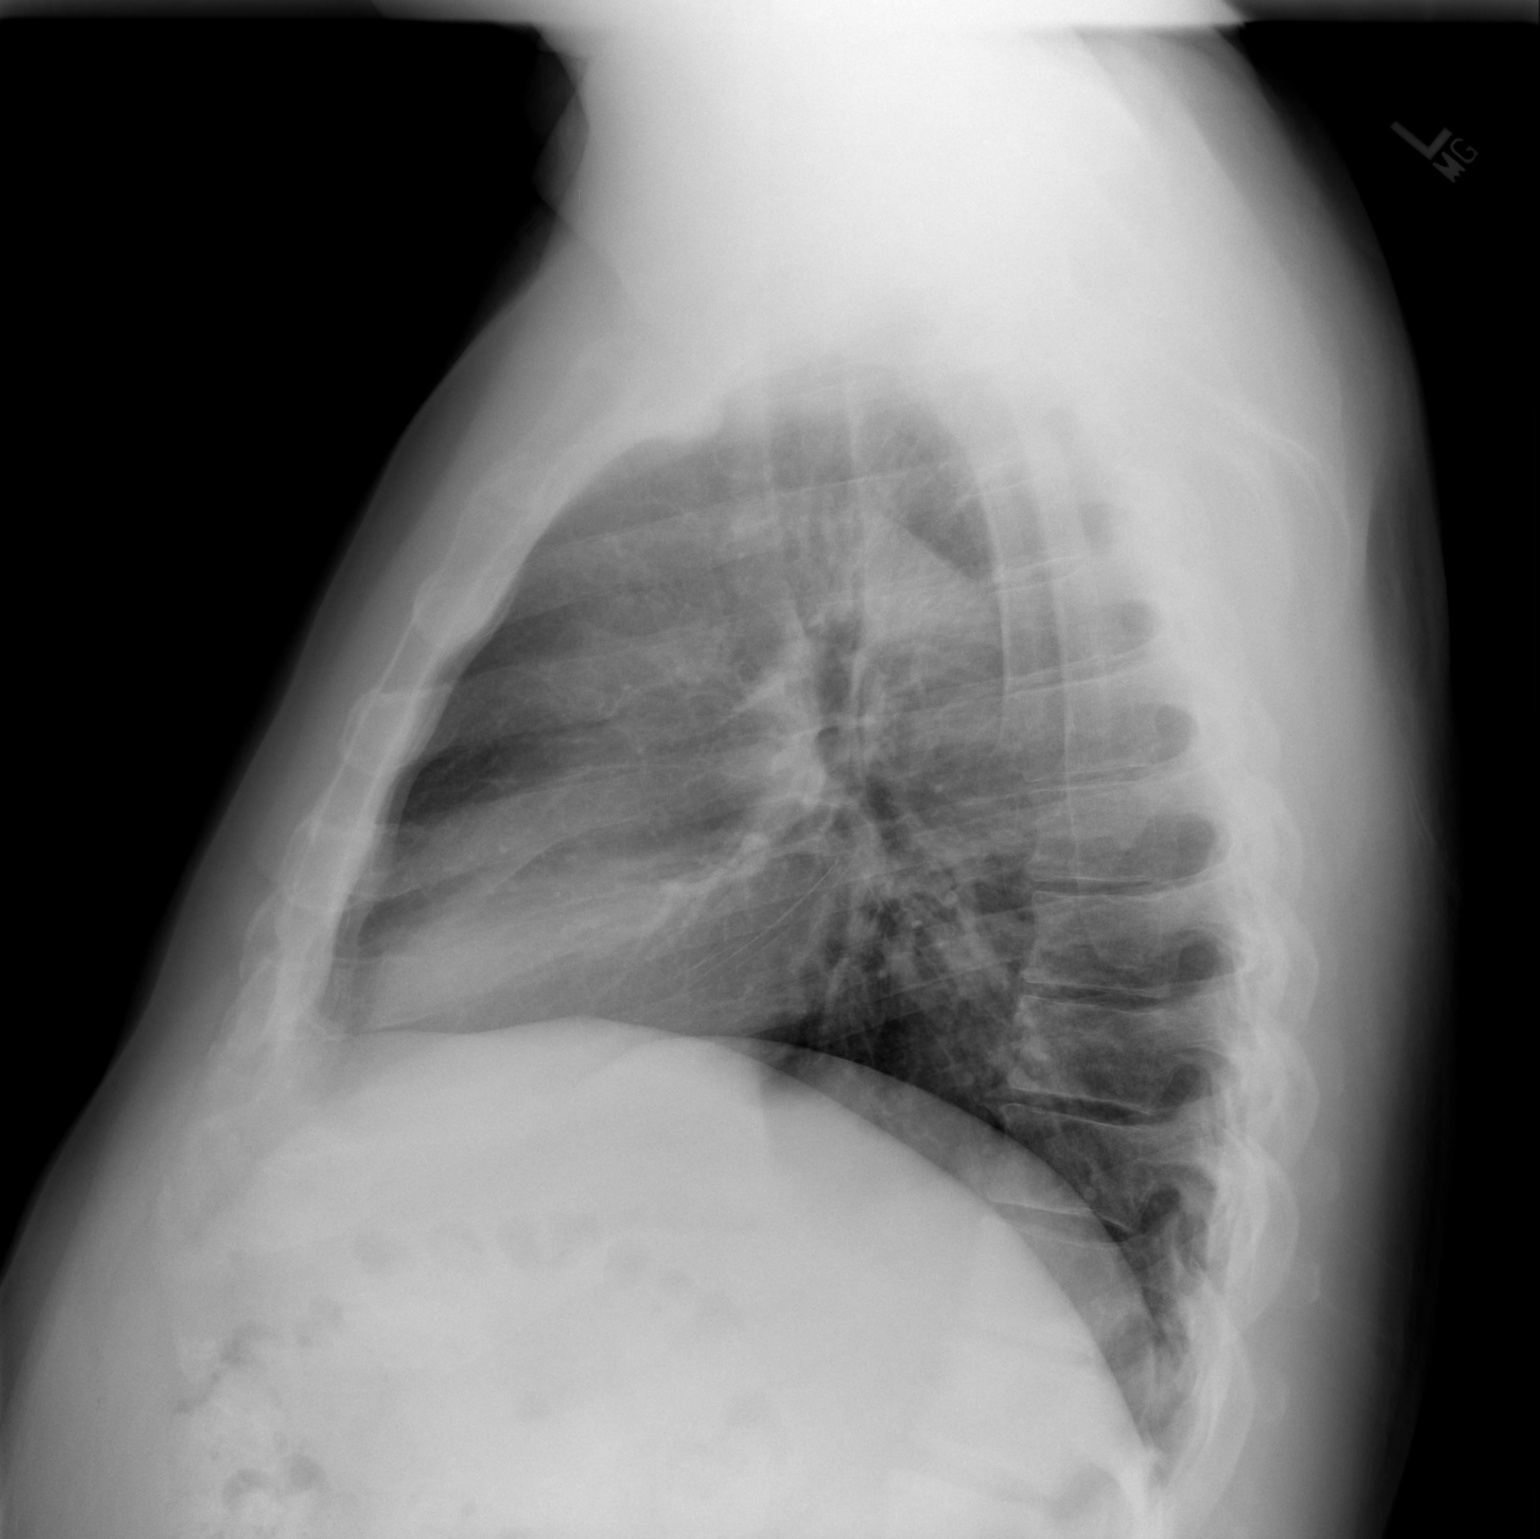

[2 of 2 positions shown; findings below may reference images not displayed]

FINDINGS: Midline trachea.  Normal heart size and mediastinal
contours. No pleural effusion or pneumothorax.  Left upper lobe
calcified granuloma.  Right lung clear.
IMPRESSION: No acute cardiopulmonary disease.

## 2014-01-23 ENCOUNTER — Encounter (HOSPITAL_COMMUNITY): Payer: Self-pay | Admitting: Cardiology
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
## 2015-03-25 ENCOUNTER — Ambulatory Visit: Payer: Self-pay | Admitting: Physician Assistant

## 2015-05-19 ENCOUNTER — Encounter: Payer: Self-pay | Admitting: Cardiology
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
## 2015-05-20 ENCOUNTER — Encounter: Payer: Self-pay | Admitting: *Deleted

## 2015-09-02 ENCOUNTER — Encounter: Payer: Self-pay | Admitting: Emergency Medicine

## 2015-09-02 ENCOUNTER — Emergency Department
Admission: EM | Admit: 2015-09-02 | Discharge: 2015-09-02 | Disposition: A | Discharge status: Home / Self Care | Payer: Self-pay | Attending: Emergency Medicine | Admitting: Emergency Medicine

## 2015-09-02 ENCOUNTER — Emergency Department: Payer: Self-pay

## 2015-09-02 DIAGNOSIS — Z8719 Personal history of other diseases of the digestive system: Secondary | ICD-10-CM | POA: Insufficient documentation

## 2015-09-02 DIAGNOSIS — F418 Other specified anxiety disorders: Secondary | ICD-10-CM | POA: Insufficient documentation

## 2015-09-02 DIAGNOSIS — Z955 Presence of coronary angioplasty implant and graft: Secondary | ICD-10-CM | POA: Insufficient documentation

## 2015-09-02 DIAGNOSIS — Z7982 Long term (current) use of aspirin: Secondary | ICD-10-CM | POA: Insufficient documentation

## 2015-09-02 DIAGNOSIS — I10 Essential (primary) hypertension: Secondary | ICD-10-CM | POA: Insufficient documentation

## 2015-09-02 DIAGNOSIS — M545 Low back pain, unspecified: Secondary | ICD-10-CM

## 2015-09-02 DIAGNOSIS — M1990 Unspecified osteoarthritis, unspecified site: Secondary | ICD-10-CM | POA: Insufficient documentation

## 2015-09-02 DIAGNOSIS — I2510 Atherosclerotic heart disease of native coronary artery without angina pectoris: Secondary | ICD-10-CM | POA: Insufficient documentation

## 2015-09-02 DIAGNOSIS — Z79899 Other long term (current) drug therapy: Secondary | ICD-10-CM | POA: Insufficient documentation

## 2015-09-02 MED ORDER — ETODOLAC 400 MG PO TABS: 400.0000 mg | ORAL_TABLET | Freq: Two times a day (BID) | ORAL | Status: DC

## 2015-09-02 MED ORDER — OXYCODONE-ACETAMINOPHEN 5-325 MG PO TABS: 1.0000 | ORAL_TABLET | ORAL | Status: DC | PRN

## 2015-09-02 MED ORDER — HYDROMORPHONE HCL 1 MG/ML IJ SOLN
1.0000 mg | Freq: Once | INTRAMUSCULAR | Status: DC
  Administered 2015-09-02: 1 mg via INTRAVENOUS
  Filled 2015-09-02: qty 1

## 2015-09-02 MED ORDER — HYDROMORPHONE HCL 1 MG/ML IJ SOLN
1.0000 mg | Freq: Once | INTRAMUSCULAR | Status: AC
  Administered 2015-09-02: 1 mg via INTRAMUSCULAR

## 2015-09-02 MED ORDER — METHOCARBAMOL 500 MG PO TABS: 1000.0000 mg | ORAL_TABLET | Freq: Four times a day (QID) | ORAL | Status: DC

## 2015-09-02 MED ORDER — DIAZEPAM 2 MG PO TABS
2.0000 mg | ORAL_TABLET | Freq: Once | ORAL | Status: AC
  Administered 2015-09-02: 2 mg via ORAL
  Filled 2015-09-02: qty 1

## 2015-09-02 NOTE — ED Notes (Signed)
Pt having lower back pain x1 week was seen at urgent care on Sunday , was given a muscle relaxer, and steroid with not relief of pain , "pain has acutually increased" , pt denies any issues with bladder function, denies radiating pain or tingling sensation to lower extremities.

## 2015-09-02 NOTE — Discharge Instructions (Signed)
Back Pain, Adult Back pain is very common. The pain often gets better over time. The cause of back pain is usually not dangerous. Most people can learn to manage their back pain on their own.  HOME CARE  Watch your back pain for any changes. The following actions may help to lessen any pain you are feeling:  Stay active. Start with short walks on flat ground if you can. Try to walk farther each day.  Exercise regularly as told by your doctor. Exercise helps your back heal faster. It also helps avoid future injury by keeping your muscles strong and flexible.  Do not sit, drive, or stand in one place for more than 30 minutes.  Do not stay in bed. Resting more than 1-2 days can slow down your recovery.  Be careful when you bend or lift an object. Use good form when lifting:  Bend at your knees.  Keep the object close to your body.  Do not twist.  Sleep on a firm mattress. Lie on your side, and bend your knees. If you lie on your back, put a pillow under your knees.  Take medicines only as told by your doctor.  Put ice on the injured area.  Put ice in a plastic bag.  Place a towel between your skin and the bag.  Leave the ice on for 20 minutes, 2-3 times a day for the first 2-3 days. After that, you can switch between ice and heat packs.  Avoid feeling anxious or stressed. Find good ways to deal with stress, such as exercise.  Maintain a healthy weight. Extra weight puts stress on your back. GET HELP IF:   You have pain that does not go away with rest or medicine.  You have worsening pain that goes down into your legs or buttocks.  You have pain that does not get better in one week.  You have pain at night.  You lose weight.  You have a fever or chills. GET HELP RIGHT AWAY IF:   You cannot control when you poop (bowel movement) or pee (urinate).  Your arms or legs feel weak.  Your arms or legs lose feeling (numbness).  You feel sick to your stomach (nauseous) or  throw up (vomit).  You have belly (abdominal) pain.  You feel like you may pass out (faint).   This information is not intended to replace advice given to you by your health care provider. Make sure you discuss any questions you have with your health care provider.   Document Released: 07/20/2007 Document Revised: 02/21/2014 Document Reviewed: 06/04/2013 Elsevier Interactive Patient Education 2016 [place] taking Flexeril at home. Finish your steroid pack. Begin taking Robaxin 2 tablets 4 times a day for muscle spasms. Etodolac 400 mg twice a day with food. Percocet every 4 hours as needed for pain. Moist heat or ice to your back frequently as needed for comfort. Follow-up with Dr. Marry Guan if any continued problems.
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
## 2015-09-02 NOTE — ED Notes (Signed)
Pt alert and oriented X4, active, cooperative, pt in NAD. RR even and unlabored, color WNL.  Pt informed to return if any life threatening symptoms occur.   

## 2015-09-02 NOTE — ED Notes (Addendum)
Patient transported to X-ray 

## 2015-09-02 NOTE — ED Notes (Signed)
Pt presents with low back pain started last week. 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

## 2016-10-20 ENCOUNTER — Telehealth: Payer: Self-pay

## 2016-10-20 NOTE — Telephone Encounter (Signed)
SENT NOTES TO SCHEDULING 

## 2016-11-22 ENCOUNTER — Telehealth: Payer: Self-pay | Admitting: Cardiology

## 2016-11-22 NOTE — Telephone Encounter (Signed)
Received records from [facility] for appointment on 12/16/16 with Dr Ellyn Hack.  Records put with Dr Allison Quarry schedule for 12/16/16. lp

## 2016-12-02 DIAGNOSIS — R202 Paresthesia of skin: Secondary | ICD-10-CM | POA: Insufficient documentation

## 2016-12-02 DIAGNOSIS — M79642 Pain in left hand: Secondary | ICD-10-CM | POA: Insufficient documentation

## 2016-12-16 ENCOUNTER — Encounter: Payer: Self-pay | Admitting: Cardiology

## 2016-12-16 ENCOUNTER — Ambulatory Visit (INDEPENDENT_AMBULATORY_CARE_PROVIDER_SITE_OTHER): Payer: 59 | Admitting: Cardiology

## 2016-12-16 VITALS — BP 150/88 | HR 63 | Ht 73.0 in | Wt 256.6 lb

## 2016-12-16 DIAGNOSIS — Z951 Presence of aortocoronary bypass graft: Secondary | ICD-10-CM | POA: Diagnosis not present

## 2016-12-16 DIAGNOSIS — I25119 Atherosclerotic heart disease of native coronary artery with unspecified angina pectoris: Secondary | ICD-10-CM | POA: Diagnosis not present

## 2016-12-16 DIAGNOSIS — E669 Obesity, unspecified: Secondary | ICD-10-CM

## 2016-12-16 DIAGNOSIS — E785 Hyperlipidemia, unspecified: Secondary | ICD-10-CM

## 2016-12-16 DIAGNOSIS — R7309 Other abnormal glucose: Secondary | ICD-10-CM | POA: Diagnosis not present

## 2016-12-16 DIAGNOSIS — I10 Essential (primary) hypertension: Secondary | ICD-10-CM

## 2016-12-16 NOTE — Progress Notes (Signed)
PCP: [facility] Note: Chief Complaint  Patient presents with  . New Patient (Initial Visit)    Reestablish cardiology care.  Last seen almost 4 years ago  . Coronary Artery Disease    -CABG    HPI: Joel Walters is a 66 y.o. male with past medical history of CAD-CABG who is being seen today to reestablish cardiology care at the request of [name], [name], [place].  [name] has known CAD with CABG.  He was initially evaluated for significant exertional dyspnea and dizziness with chest pressure.   --> abnormal Myoview impression large size (partially reversible moderate intensity perfusion defect in basal-apical inferoseptal and inferior wall -consistent with ischemia)--> Cardiac Cath December 2012 => multivessel CAD --> CABG x5 in December 2012 (LIMA TO LAD,SVG to D1, SVG to OM,seq SVG to RVM-rPDA), Dr. Servando Snare--  follow-up Myoview in August 2013 was negative for ischemia. The first year after his CABG he lost 30 pounds with diet and exercise and have blood pressure control.  RYOT BURROUS was last seen on December 2014.  He was doing well from a cardiac standpoint, but he had been sedentary for about a year. Was last seen by his PCP in May 2018.  He also saw [facility] neurologist for hand numbness.  Recent Hospitalizations: None  Studies Personally Reviewed - (if available, images/films reviewed: From Epic Chart or Care Everywhere)  NONE SINCE 2013  Interval History: [name] returns today for a long overdue follow-up with no major cardiac complaints.  He is very active going up and down stairs walking over the face with work all day long.  He does not do routine exercise, but is very active and denies any chest tightness or pressure with rest or exertion.  No exertional dyspnea.  No PND, orthopnea or edema. No palpitations, lightheadedness, dizziness, weakness or syncope/near syncope. No TIA/amaurosis fugax  symptoms. No melena, hematochezia, hematuria, or epstaxis. No claudication.  ROS: A comprehensive was performed. Review of Systems  Constitutional: Negative for malaise/fatigue and weight loss (Eating more than usual since remarrying).  HENT: Positive for hearing loss. Negative for congestion and nosebleeds.   Respiratory: Negative for cough.   Gastrointestinal: Negative for abdominal pain, blood in stool, heartburn and melena.  Genitourinary: Negative for dysuria and hematuria.  Musculoskeletal: Positive for back pain (Chronic back pain). Negative for falls (None since last December), joint pain and myalgias.  Neurological: Positive for dizziness (Occasionally notes positional dizziness) and tingling (Left arm numbness and tingling). Negative for focal weakness, loss of consciousness (No notable syncope since last December timeframe.), weakness and headaches.  Psychiatric/Behavioral: Negative for depression and memory loss. The patient has insomnia (Has a hard time falling asleep). The patient is not nervous/anxious.   All other systems reviewed and are negative.   I have reviewed and (if needed) personally updated the patient's problem list, medications, allergies, past medical and surgical history, social and family history.   Past Medical History:  Diagnosis Date  . CAD, multiple vessel    MULTIVESSEL cad (mLAD (D1-past D2) 70-80% involving from Diags with ostial ~70%. pCx 70-80% (prior to Lat OM1) with normal AVG Cx& LPL w/ L-R Collaterals (small OM2 80-90%).  Large RI - normal. RCA moderate caliber ~99% (chronic subtotal CTO) --> s/p CABG x5   . GERD (gastroesophageal reflux disease)   . History of stomach ulcers   . Hypertension   . Prostate infection 2011  . 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 Truncal obesity   . Tumor of breast    REMOVED AT AGE 49 YEARS OLD  . Upper GI bleed 1980   PUD/ with blood transfusion     Past Surgical History:   Procedure Laterality Date  . CARDIAC CATHETERIZATION  02-02-2011   EF 60-65%;SEVERE 3 VESSEL DISEASE ,proximal/early mid RCA likely 100% occluded;proxim/early mid LAD 80% stenosis ivolving D1 and D2; Circ  and focal 70% STENOSIS care bifurcation of OM1 and AV  groove Circ  . DOPPLER ECHOCARDIOGRAPHY  02/09/2011  . INTRAOPERATIVE TEE  02/11/2011  . IRRIGATION AND DEBRIDEMENT SEBACEOUS CYST  age 26   left breast  . NASAL SINUS SURGERY  2010  . NM [facility] MULTIPLE W/SPECT  01/13/2011   showing large reversible basal inferoseptal,basal inferior,midinferior septal ,mid inferior,and apical inferior defect, likely due to the RCA lesion found on cathterization  . NM MYOCAR MULTIPLE W/SPECT  10/14/2011   EF 61%;  EXERCISE CAPACITY 10 METS, NO INDUCILE MYOCARDIAL ISCHEMIA   Cath 01/2011: mLAD (D1-past D2) 70-80% involving from Diags with ostial ~70%. pCx 70-80% (prior to Lat OM1) with normal AVG Cx& LPL w/ L-R Collaterals (small OM2 80-90%).  Large RI - normal. RCA moderate caliber ~99% (chronic subtotal CTO).  Current Meds  Medication Sig  . aspirin EC 81 MG tablet Take 81 mg by mouth daily.  . Coenzyme Q10 (COQ10 PO) Take by mouth.  . losartan (COZAAR) 50 MG tablet Take 50 mg by mouth daily.   . metoprolol tartrate (LOPRESSOR) 25 MG tablet TAKE ONE TABLET BY MOUTH TWICE DAILY  . Multiple Vitamins-Minerals (MULTIVITAMINS THER. W/MINERALS) TABS Take 1 tablet by mouth daily.     Allergies  Allergen Reactions  . Codeine     REACTION: Jittery, itches  . Crestor [Rosuvastatin] Other (See Comments)    "BROKE OUT PER PATIENT,HE STOPPED"  . Lipitor [Atorvastatin]     Cramping  . Lisinopril Cough    Social History   Socioeconomic History  . Marital status: Married    Spouse name: None  . Number of children: None  . Years of education: None  . Highest education level: None  Social Needs  . Financial resource strain: None  . Food insecurity - worry: None  . Food insecurity - inability:  None  . Transportation needs - medical: None  . Transportation needs - non-medical: None  Occupational History  . None  Tobacco Use  . Smoking status: Never Smoker  . Smokeless tobacco: Never Used  Substance and Sexual Activity  . Alcohol use: Yes    Comment: occ  . Drug use: No  . Sexual activity: Yes  Other Topics Concern  . None  Social History Narrative   He is essentially now a "widower" as his long-term partner / "wife" was killed in a car accident this spring.  He has been having a hard time dealing with it.  When this happened, he was in the middle of changing jobs, & was simply unable to handle the time restrictions & rigors of the new job with his grief.  He is currently no longer employed.    He has 3  children from his first marriage and 9 grandchildren.  With his grieving, he has not been exercising like he used to & has been putting back on all of the weight that he had lost post-operatively.    He has never smoked & does not drink Alcohol.    family  history includes Diabetes in his son. He was adopted.  Wt Readings from Last 3 Encounters:  12/16/16 256 lb 9.6 oz (116.4 kg)  09/02/15 244 lb (110.7 kg)  01/24/13 248 lb 1.6 oz (112.5 kg)    PHYSICAL EXAM BP (!) 150/88   Pulse 63   Ht 6\' 1"  (1.854 m)   Wt 256 lb 9.6 oz (116.4 kg)   BMI 33.85 kg/m  Physical Exam  Constitutional: He is oriented to person, place, and time. He appears well-developed and well-nourished. No distress.  Healthy-appearing.  Well-groomed.  Definitely lighter than last visit  HENT:  Head: Normocephalic and atraumatic.  Mouth/Throat: No oropharyngeal exudate.  Eyes: EOM are normal. Pupils are equal, round, and reactive to light. No scleral icterus.  Neck: Normal range of motion. Neck supple. No hepatojugular reflux and no JVD present. Carotid bruit is not present.  Cardiovascular: Normal rate and regular rhythm.  No extrasystoles are present. PMI is not displaced. Exam reveals no gallop, no  distant heart sounds and no friction rub.  No murmur heard. Pulmonary/Chest: Effort normal and breath sounds normal. No respiratory distress. He has no wheezes. He has no rales. He exhibits no tenderness.  Abdominal: Soft. Bowel sounds are normal. He exhibits no distension. There is no tenderness. There is no rebound.  Musculoskeletal: Normal range of motion. He exhibits no edema or deformity.  Neurological: He is alert and oriented to person, place, and time. No cranial nerve deficit.  Skin: Skin is warm and dry. No rash noted. No erythema.  Psychiatric: He has a normal mood and affect. His behavior is normal. Judgment and thought content normal.  Nursing note and vitals reviewed.    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  No active anginal symptoms since I last saw him. He does note that he has been eating more than usual since remarrying this June.  Despite that he is still walking all over the place including up and down stairs without any symptoms. On beta-blocker and ARB along with aspirin. Not on statin which can be discussed at follow-up. He is due for Myoview Stress Test as it has been ~ 5 years since most recent.  Decrease to 81 mg of aspirin      Relevant Orders   Myocardial Perfusion Imaging   EKG 12-Lead   Essential hypertension (Chronic)    Blood pressure looks poorly controlled today with losartan and Lopressor.  He tells me his blood  pressures are much better they are today.  His blood pressures at his PCPs office have been also well controlled. For now we will continue current meds.  We can assess his blood pressure response during stress test in order to determine if further medication adjustments are required.  Is being followed up by his PCP every 6 months, therefore is due to be seen soon.      Relevant Orders   Myocardial Perfusion Imaging  EKG 12-Lead   Hyperlipidemia with target low density lipoprotein (LDL) cholesterol less than 70 mg/dL (Chronic)   Relevant Orders   Myocardial Perfusion Imaging   EKG 12-Lead   Obesity (BMI 30-39.9) (Chronic)    He had done so well after his MI initially, but now has put on quite a bit of weight, almost putting all the weight back on. He does indicate that he is probably eating more than usual because of his recent remarriage.  I did encourage him to moderate how much he eats, and continue his exercise.      PREDIABETES (Chronic)    Monitored by PCP      S/P CABG (coronary artery bypass graft) (Chronic)    Multivessel CABG.  Negative Myoview in 2013.  Due for follow-up now.      Relevant Orders   Myocardial Perfusion Imaging   EKG 12-Lead      Current medicines are reviewed at length with the patient today. (+/- concerns) n/a The following changes have been made: take only 81mg  ASA  Patient Instructions  Medication Instructions:  DECREASE Aspirin 81mg  take 1 tablet once a day   Labwork: None   Testing/Procedures: Your physician has requested that you have an exercise stress myoview. For further information please visit [email or web address]. Please follow instruction sheet, as given. Schedule test for January 2019  Follow-Up: Your physician recommends that you schedule a follow-up appointment in: after myoview and appt with PCP  Any Other Special Instructions Will Be Listed Below (If Applicable).  If you need a refill on your cardiac medications before  your next appointment, please call your pharmacy.    Studies Ordered:   Orders Placed This Encounter  Procedures  . Myocardial Perfusion Imaging  . EKG 12-Lead      Glenetta Hew, M.D., M.S. Interventional Cardiologist   Pager # [phone] Phone # [phone] [address] [place], [place] [address]

## 2016-12-16 NOTE — Patient Instructions (Addendum)
Medication Instructions:  DECREASE Aspirin 81mg  take 1 tablet once a day   Labwork: None   Testing/Procedures: Your physician has requested that you have an exercise stress myoview. For further information please visit [email or web address]. Please follow instruction sheet, as given. Schedule test for January 2019  Follow-Up: Your physician recommends that you schedule a follow-up appointment in: after myoview and appt with PCP  Any Other Special Instructions Will Be Listed Below (If Applicable).  If you need a refill on your cardiac medications before your next appointment, please call your pharmacy.

## 2016-12-18 ENCOUNTER — Encounter: Payer: Self-pay | Admitting: Cardiology

## 2016-12-18 NOTE — Assessment & Plan Note (Signed)
He had done so well after his MI initially, but now has put on quite a bit of weight, almost putting all the weight back on. He does indicate that he is probably eating more than usual because of his recent remarriage.  I did encourage him to moderate how much he eats, and continue his exercise.

## 2016-12-18 NOTE — Assessment & Plan Note (Signed)
Blood pressure looks poorly controlled today with losartan and Lopressor.  He tells me his blood pressures are much better they are today.  His blood pressures at his PCPs office have been also well controlled. For now we will continue current meds.  We can assess his blood pressure response during stress test in order to determine if further medication adjustments are required.  Is being followed up by his PCP every 6 months, therefore is due to be seen soon.

## 2016-12-18 NOTE — Assessment & Plan Note (Signed)
Multivessel CABG.  Negative Myoview in 2013.  Due for follow-up now.

## 2016-12-18 NOTE — Assessment & Plan Note (Addendum)
Quite asymptomatic.  No active anginal symptoms since I last saw him. He does note that he has been eating more than usual since remarrying this June.  Despite that he is still walking all over the place including up and down stairs without any symptoms. On beta-blocker and ARB along with aspirin. Not on statin which can be discussed at follow-up. He is due for Myoview Stress Test as it has been ~ 5 years since most recent.  Decrease to 81 mg of aspirin

## 2016-12-18 NOTE — Assessment & Plan Note (Signed)
Monitored by PCP

## 2016-12-27 DIAGNOSIS — G5603 Carpal tunnel syndrome, bilateral upper limbs: Secondary | ICD-10-CM | POA: Insufficient documentation

## 2017-02-28 ENCOUNTER — Telehealth: Payer: Self-pay | Admitting: Cardiology

## 2017-02-28 NOTE — Telephone Encounter (Signed)
LEFT MESSAGE TO CALL BACK TH WIFE TRACY Harren  THE ORDER IS FOR EXERCISE MYOVIEW , IF PATIENT WANTS LEXISCAN WILL HAVE TO GET ORDER FROM DR Cosmos. ALSO NEED THE REASON WHY MYOVIEW NEEDS TO BE SWITCH?

## 2017-02-28 NOTE — Telephone Encounter (Signed)
New message ° °Pt wife verbalized that she is returning call for RN °

## 2017-02-28 NOTE — Telephone Encounter (Signed)
Left message to call back  

## 2017-02-28 NOTE — Telephone Encounter (Signed)
New Message   Patients wife is calling to see what type of myocardial the patient will be having. She states that he has indicated that he does not want to be on the treadmill but prefers the [place]. Please call to discuss.

## 2017-03-08 ENCOUNTER — Telehealth (HOSPITAL_COMMUNITY): Payer: Self-pay

## 2017-03-08 NOTE — Telephone Encounter (Signed)
Unable to reach patient.

## 2017-03-09 ENCOUNTER — Telehealth (HOSPITAL_COMMUNITY): Payer: Self-pay

## 2017-03-09 NOTE — Telephone Encounter (Signed)
Encounter complete. 

## 2017-03-10 ENCOUNTER — Ambulatory Visit (HOSPITAL_COMMUNITY)
Admission: RE | Admit: 2017-03-10 | Discharge: 2017-03-10 | Disposition: A | Discharge status: Home / Self Care | Payer: BLUE CROSS/BLUE SHIELD | Source: Ambulatory Visit | Attending: Cardiology | Admitting: Cardiology

## 2017-03-10 DIAGNOSIS — R079 Chest pain, unspecified: Secondary | ICD-10-CM | POA: Insufficient documentation

## 2017-03-10 DIAGNOSIS — Z951 Presence of aortocoronary bypass graft: Secondary | ICD-10-CM | POA: Diagnosis not present

## 2017-03-10 DIAGNOSIS — E669 Obesity, unspecified: Secondary | ICD-10-CM | POA: Insufficient documentation

## 2017-03-10 DIAGNOSIS — I10 Essential (primary) hypertension: Secondary | ICD-10-CM | POA: Diagnosis not present

## 2017-03-10 DIAGNOSIS — Z6833 Body mass index (BMI) 33.0-33.9, adult: Secondary | ICD-10-CM | POA: Insufficient documentation

## 2017-03-10 DIAGNOSIS — I25119 Atherosclerotic heart disease of native coronary artery with unspecified angina pectoris: Secondary | ICD-10-CM | POA: Diagnosis present

## 2017-03-10 DIAGNOSIS — E785 Hyperlipidemia, unspecified: Secondary | ICD-10-CM | POA: Diagnosis not present

## 2017-03-10 HISTORY — PX: NM MYOCAR MULTIPLE W/SPECT: HXRAD626

## 2017-03-10 LAB — MYOCARDIAL PERFUSION IMAGING
CHL CUP NUCLEAR SDS: 0
Estimated workload: 9.8 METS
Exercise duration (min): 8 min
Exercise duration (sec): 40 s
LVDIAVOL: 126 mL (ref 62–150)
LVSYSVOL: 52 mL
MPHR: 156 {beats}/min
NUC STRESS TID: 0.94
Peak HR: 144 {beats}/min
Percent HR: 92 %
RPE: 18
Rest HR: 70 {beats}/min
SRS: 1
SSS: 1

## 2017-03-10 MED ORDER — TECHNETIUM TC 99M TETROFOSMIN IV KIT
10.6000 | PACK | Freq: Once | INTRAVENOUS | Status: AC | PRN
  Administered 2017-03-10: 10.6 via INTRAVENOUS
  Filled 2017-03-10: qty 11

## 2017-03-10 MED ORDER — TECHNETIUM TC 99M TETROFOSMIN IV KIT
31.4000 | PACK | Freq: Once | INTRAVENOUS | Status: AC | PRN
  Administered 2017-03-10: 31.4 via INTRAVENOUS
  Filled 2017-03-10: qty 32

## 2017-03-10 NOTE — Telephone Encounter (Signed)
Unable to talk with wife patient ,had  Test was completed today  nuclear technician if need can change the way test is administered.

## 2017-03-13 NOTE — Progress Notes (Signed)
Stress Test looked good!! No sign of significant Heart Artery Disease.  Pump function is normal.  Good news!!.  HARDING,DAVID W, MD  pls fwd to PCP:  [facility] [place]

## 2017-03-23 ENCOUNTER — Ambulatory Visit: Payer: BLUE CROSS/BLUE SHIELD | Admitting: Cardiology

## 2017-03-24 ENCOUNTER — Encounter: Payer: Self-pay | Admitting: *Deleted

## 2017-05-15 ENCOUNTER — Ambulatory Visit: Payer: Medicare HMO | Admitting: Cardiology

## 2017-05-15 ENCOUNTER — Encounter: Payer: Self-pay | Admitting: Cardiology

## 2017-05-15 VITALS — BP 156/88 | HR 64 | Ht 73.0 in | Wt 256.2 lb

## 2017-05-15 DIAGNOSIS — I10 Essential (primary) hypertension: Secondary | ICD-10-CM

## 2017-05-15 DIAGNOSIS — I2510 Atherosclerotic heart disease of native coronary artery without angina pectoris: Secondary | ICD-10-CM | POA: Diagnosis not present

## 2017-05-15 DIAGNOSIS — E669 Obesity, unspecified: Secondary | ICD-10-CM

## 2017-05-15 DIAGNOSIS — E785 Hyperlipidemia, unspecified: Secondary | ICD-10-CM

## 2017-05-15 MED ORDER — EZETIMIBE 10 MG PO TABS: 10.0000 mg | ORAL_TABLET | Freq: Every day | ORAL | 3 refills | Status: DC

## 2017-05-15 MED ORDER — PRAVASTATIN SODIUM 20 MG PO TABS: 20.0000 mg | ORAL_TABLET | Freq: Every evening | ORAL | 3 refills | Status: DC

## 2017-05-15 MED ORDER — METOPROLOL SUCCINATE ER 50 MG PO TB24: 50.0000 mg | ORAL_TABLET | Freq: Every day | ORAL | 3 refills | Status: DC

## 2017-05-15 NOTE — Assessment & Plan Note (Signed)
Remains a symptomatically this point.  No anginal symptoms since 2014 and now with negative Myoview with no evidence of ischemia.  There was clear ischemia prior to his CABG which indicates that his RCA distribution is fully revascularized.  Remains on beta-blocker (but he is taking it once daily as well as losartan. Plan: Convert from metoprolol tartrate to 50 mg Toprol.  We need to address his lipids.  Continue co-Q10.  Likely start Zetia and Pravachol and referred to [facility] ([facility] -here in the [facility] office with our Pharm.D. Team.)

## 2017-05-15 NOTE — Assessment & Plan Note (Addendum)
Blood pressure still little bit out of control today, but he indicates that he has yet to take his medicines this morning. Plan: Convert from metoprolol tartrate which she is taking once daily to succinate so he can take it once daily at night and then continue current dose of losartan in the morning.  Anticipate that we may very well to increase to 50 mg of losartan in the morning. He should be due to follow-up with his PCP soon (and will be also seeing our pharmacy team with [facility]) and they can reassess his blood pressure after the change in meds.  If pressures are still high, increase losartan to 100 mg daily.

## 2017-05-15 NOTE — Assessment & Plan Note (Signed)
He is due for follow-up labs that should be checked in the next month or 2. Plan: Start Pravachol 20 mg daily and Zetia 10 mg daily along with his co-Q10 so that he will have been on medications for at least 2 months prior to checkup.  Pending how the results turn out he may very well require PCSK9 inhibitor. After his labs are checked, he will follow up with [facility]

## 2017-05-15 NOTE — Progress Notes (Signed)
PCP: [facility] Note: Chief Complaint  Patient presents with  . Follow-up    post Myoview; no complaints  . Coronary Artery Disease    CABG    HPI: Joel Walters is a 67 y.o. male with past medical history of CAD-CABG who is being seen today to reestablish cardiology care at the request of [facility].  Joel Walters has known CAD with CABG.    He was initially evaluated for significant exertional dyspnea and dizziness with chest pressure.   --> abnormal Myoview impression large size (partially reversible moderate intensity perfusion defect in basal-apical inferoseptal and inferior wall -consistent with ischemia)--> Cardiac Cath December 2012 => multivessel CAD --> CABG x5 in December 2012 (LIMA TO LAD,SVG to D1, SVG to OM,seq SVG to RVM-rPDA), Dr. Servando Snare--  follow-up Myoview in August 2013 was negative for ischemia. The first year after his CABG he lost 30 pounds with diet and exercise and have blood pressure control. Was lost to follow-up from December 2014 until seen in November 2018.  Joel Walters was last seen on December 16, 2016 with no major cardiac complaints.  Very active with no ongoing symptoms.  Had been many years since having ischemic evaluation so he underwent Myoview stress testing.  Follow-up  Recent Hospitalizations: None  Studies Personally Reviewed - (if available, images/films reviewed: From Epic Chart or Care Everywhere)  Myoview March 10, 2017: Reached 92% of peak heart rate 144 bpm.  Good exercise tolerance.  EF 55-65%.  LOW RISK.  No ischemia or infarction.  Interval History: Joel Walters returns today for follow-up after his Myoview stress test.  (rescheduled 2/2 ongoing sinus infection).  He states that he continues to do very well with no major complaints.  He is now much more active than he had been before.  He is increase his activity level and hoping to get it excised regimen.   He is also trying to adjust his diet.  He has not been able to take Lipitor or Crestor and he thinks he was placed on Zocor at one time as well.  He just had significant fatigue and myalgias associated with them.   He remains asymptomatic from a cardiac standpoint, and indicates that he is very active walking around up and down stairs and all over the place as well as doing routine exercise now. He denies any resting exertional chest tightness/pressure or dyspnea.  No PND, orthopnea or edema.  Cardiovascular Revie2w of Symptoms: no chest pain or dyspnea on exertion positive for - Mild end of day swelling and myalgias with statin. negative for - irregular heartbeat, murmur, orthopnea, palpitations, paroxysmal nocturnal dyspnea, rapid heart rate, shortness of breath or Syncope/near-syncope, TIA/amaurosis fugax, claudication  ROS: A comprehensive was performed. Review of Systems  Constitutional: Negative for malaise/fatigue and weight loss (trying to change diet & increase walking).  HENT: Positive for hearing loss. Negative for congestion and nosebleeds.   Respiratory: Negative for cough.   Gastrointestinal: Negative for abdominal pain, blood in stool, heartburn and melena.  Genitourinary: Negative for dysuria and hematuria.  Musculoskeletal: Positive for back pain (Chronic back pain). Negative for falls (None since last December), joint pain and myalgias.  Neurological: Positive for tingling (Left arm numbness and tingling). Negative for dizziness (Occasionally notes positional dizziness), focal weakness, loss of consciousness (No notable syncope since last December timeframe.), weakness and headaches.  Psychiatric/Behavioral: Negative for depression and memory loss. The patient has insomnia (Has  a hard time falling asleep). The patient is not nervous/anxious.   All other systems reviewed and are negative.   I have reviewed and (if needed) personally updated the patient's problem list,  medications, allergies, past medical and surgical history, social and family history.   Past Medical History:  Diagnosis Date  . CAD, multiple vessel    MULTIVESSEL cad (mLAD (D1-past D2) 70-80% involving from Diags with ostial ~70%. pCx 70-80% (prior to Lat OM1) with normal AVG Cx& LPL w/ L-R Collaterals (small OM2 80-90%).  Large RI - normal. RCA moderate caliber ~99% (chronic subtotal CTO) --> s/p CABG x5   . GERD (gastroesophageal reflux disease)   . History of stomach ulcers   . Hypertension   . Prostate infection 2011  . 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 Truncal obesity   . Tumor of breast    REMOVED AT AGE 66 YEARS OLD  . Upper GI bleed 1980   PUD/ with blood transfusion    Past Surgical History:  Procedure Laterality Date  . CAPSULAR RELEASE  01/05/2012   Procedure: CAPSULAR RELEASE;  Surgeon: Cammie Sickle., MD;  Location: [facility];  Service: Orthopedics;  Laterality: Right;  PIP CAPSULE RELEASE, FDS(FLEXOR DIGITORIUM SUPERFICIALIS) TENODESIS,  RIGHT SMALL RING FINGER,volar check rain ligament  . CORONARY ARTERY BYPASS GRAFT  02/11/2011   Procedure: CORONARY ARTERY BYPASS GRAFTING (CABG);  Surgeon: Grace Isaac, MD;  Location: [facility];  Service: Open Heart Surgery;  Laterality: N/A;;;LIMA TO LAD,SVG to D1, SVG to OM,SVG - RVM - PDA (SEQUENTIAL)  . DOPPLER ECHOCARDIOGRAPHY  02/09/2011  . INTRAOPERATIVE TEE  02/11/2011  . IRRIGATION AND DEBRIDEMENT SEBACEOUS CYST  age 89   left breast  . LEFT HEART CATHETERIZATION WITH CORONARY ANGIOGRAM N/A 02/02/2011   Procedure: LEFT HEART CATHETERIZATION WITH CORONARY ANGIOGRAM;  Surgeon: Leonie Man, MD;  Location: [facility] CATH LAB;; EF 60-65%;SEVERE 3 VESSEL DISEASE , mLAD (D1-past D2) 70-80% involving both Diags w/ ostial ~70%. pCx 70-80% (prior to Lat OM1-p50%)) with normal AVG Cx& LPL w/ L-R Collaterals (small OM2 80-90%).  Large RI - normal. RCA moderate caliber ~99% (chronic  subtotal CTO).  Marland Kitchen NASAL SINUS SURGERY  2010  . NM [facility] MULTIPLE W/SPECT  01/13/2011   showing large reversible basal inferoseptal,basal inferior,midinferior septal ,mid inferior,and apical inferior defect, likely due to the RCA lesion found on cathterization  . NM MYOCAR MULTIPLE W/SPECT  10/14/2011   a) EF 61%;  EXERCISE CAPACITY 10 METS, NO INDUCILE MYOCARDIAL ISCHEMIA; b) Reached 92% of peak heart rate 144 bpm.  Good exercise tolerance.  EF 55-65%.  LOW RISK.  No ischemia or infarction.    Current Meds  Medication Sig  . aspirin EC 81 MG tablet Take 81 mg by mouth daily.  . Coenzyme Q10 (COQ10 PO) Take by mouth.  . losartan (COZAAR) 50 MG tablet Take 50 mg by mouth daily.   . Multiple Vitamins-Minerals (MULTIVITAMINS THER. W/MINERALS) TABS Take 1 tablet by mouth daily.   . [DISCONTINUED] metoprolol tartrate (LOPRESSOR) 25 MG tablet TAKE ONE TABLET BY MOUTH TWICE DAILY    Allergies  Allergen Reactions  . Codeine     REACTION: Jittery, itches  . Crestor [Rosuvastatin] Other (See Comments)    "BROKE OUT PER PATIENT,HE STOPPED"  . Lipitor [Atorvastatin]     Cramping  . Lisinopril Cough    Social History   Tobacco Use  . Smoking status: Never Smoker  . Smokeless  tobacco: Never Used  Substance Use Topics  . Alcohol use: Yes    Comment: occ  . Drug use: No   Social History   Social History Narrative   He is essentially now a "widower" as his long-term partner / "wife" was killed in a car accident this spring.  He has been having a hard time dealing with it.  When this happened, he was in the middle of changing jobs, & was simply unable to handle the time restrictions & rigors of the new job with his grief.  He is currently no longer employed.    He has 3  children from his first marriage and 9 grandchildren.  With his grieving, he has not been exercising like he used to & has been putting back on all of the weight that he had lost post-operatively.    He has never smoked &  does not drink Alcohol.    family history includes Diabetes in his son. He was adopted.  Wt Readings from Last 3 Encounters:  05/15/17 256 lb 3.2 oz (116.2 kg)  03/10/17 256 lb (116.1 kg)  12/16/16 256 lb 9.6 oz (116.4 kg)    PHYSICAL EXAM BP (!) 156/88   Pulse 64   Ht 6\' 1"  (1.854 m)   Wt 256 lb 3.2 oz (116.2 kg)   SpO2 96%   BMI 33.80 kg/m  Physical Exam  Constitutional: He is oriented to person, place, and time. He appears well-developed and well-nourished. No distress.  Healthy-appearing.  Well-groomed.    HENT:  Head: Normocephalic and atraumatic.  Neck: Normal range of motion. Neck supple. No hepatojugular reflux and no JVD present. Carotid bruit is not present.  Cardiovascular: Normal rate and regular rhythm.  No extrasystoles are present. PMI is not displaced. Exam reveals no gallop, no distant heart sounds and no friction rub.  No murmur heard. Pulmonary/Chest: Effort normal and breath sounds normal. No respiratory distress. He has no wheezes. He has no rales. He exhibits no tenderness.  Abdominal: Soft. Bowel sounds are normal. He exhibits no distension. There is no tenderness. There is no rebound.  Musculoskeletal: Normal range of motion. He exhibits no edema or deformity.  Neurological: He is alert and oriented to person, place, and time. No cranial nerve deficit.  Skin: Skin is warm and dry.  Psychiatric: He has a normal mood and affect. His behavior is normal. Judgment and thought content normal.  Nursing note and vitals reviewed.    Adult ECG Report n/a  Other studies Reviewed: Additional studies/ records that were reviewed today include:  Recent Labs: Jul 07, 2016 - due next month  Na+ 140, K+ 4.4, Cl- 102, HCO3- 24, BUN 19, Cr 1.07, Glu 125, Ca2+ 9.3;  TC 183, TG 85, HDL 29, LDL 137**   ASSESSMENT / PLAN: Problem List Items Addressed This Visit    Obesity (BMI 30-39.9) (Chronic)    Stable weight now for the last 4 months.  Hopefully he can work on  dietary adjustment and continue exercising as able lose weight now.  I suspect that that will help his lipids and blood pressure.  He has indicated a desire to adjust his diet, and is already started exercising.      Hyperlipidemia with target low density lipoprotein (LDL) cholesterol less than 70 mg/dL (Chronic)    He is due for follow-up labs that should be checked in the next month or 2. Plan: Start Pravachol 20 mg daily and Zetia 10 mg daily along with his  co-Q10 so that he will have been on medications for at least 2 months prior to checkup.  Pending how the results turn out he may very well require PCSK9 inhibitor. After his labs are checked, he will follow up with [facility]      Relevant Medications   metoprolol succinate (TOPROL-XL) 50 MG 24 hr tablet   pravastatin (PRAVACHOL) 20 MG tablet   ezetimibe (ZETIA) 10 MG tablet   Essential hypertension (Chronic)    Blood pressure still little bit out of control today, but he indicates that he has yet to take his medicines this morning. Plan: Convert from metoprolol tartrate which she is taking once daily to succinate so he can take it once daily at night and then continue current dose of losartan in the morning.  Anticipate that we may very well to increase to 50 mg of losartan in the morning. He should be due to follow-up with his PCP soon (and will be also seeing our pharmacy team with [facility]) and they can reassess his blood pressure after the change in meds.  If pressures are still high, increase losartan to 100 mg daily.      Relevant Medications   metoprolol succinate (TOPROL-XL) 50 MG 24 hr tablet   pravastatin (PRAVACHOL) 20 MG tablet   ezetimibe (ZETIA) 10 MG tablet   Coronary artery disease involving native coronary artery without angina pectoris - Primary (Chronic)    Remains a symptomatically this point.  No anginal symptoms since 2014 and now with negative Myoview with no evidence of ischemia.  There was clear ischemia prior to his  CABG which indicates that his RCA distribution is fully revascularized.  Remains on beta-blocker (but he is taking it once daily as well as losartan. Plan: Convert from metoprolol tartrate to 50 mg Toprol.  We need to address his lipids.  Continue co-Q10.  Likely start Zetia and Pravachol and referred to [facility] ([facility] -here in the [facility] office with our Pharm.D. Team.)      Relevant Medications   metoprolol succinate (TOPROL-XL) 50 MG 24 hr tablet   pravastatin (PRAVACHOL) 20 MG tablet   ezetimibe (ZETIA) 10 MG tablet      Current medicines are reviewed at length with the patient today. (+/- concerns) n/a The following changes have been made: take BB as BID - then convert to Toprol  Patient Instructions  Medication Instructions:  Stop Metoprolol tartrate 25 mg twice a day until you complete your bottle then start Metoprolol Succinate 50 mg once a day. Start Pravastatin 20 mg at bedtime. Take for a week then add Zetia(Ezetimibe) 10 mg daily    Labwork: Please have PCP have lab work sent at next visit   Follow-Up: Your physician recommends that you schedule a follow-up appointment in: 2-3 months with [facility]-ch[facility].   Your physician wants you to follow-up in: 43 month with Dr. Melody Haver will receive a reminder letter in the mail two months in advance. If you don't receive a letter, please call our office to schedule the follow-up appointment.        If you need a refill on your cardiac medications before your next appointment, please call your pharmacy.     Studies Ordered:   No orders of the defined types were placed in this encounter.     Glenetta Hew, M.D., M.S. Interventional Cardiologist   Pager # [phone] Phone # [phone] [address] [place], [place] [address]

## 2017-05-15 NOTE — Patient Instructions (Addendum)
Medication Instructions:  Stop Metoprolol tartrate 25 mg twice a day until you complete your bottle then start Metoprolol Succinate 50 mg once a day. Start Pravastatin 20 mg at bedtime. Take for a week then add Zetia(Ezetimibe) 10 mg daily    Labwork: Please have PCP have lab work sent at next visit   Follow-Up: Your physician recommends that you schedule a follow-up appointment in: 2-3 months with [facility]ch[facility].   Your physician wants you to follow-up in: 73 month with Dr. Melody Haver will receive a reminder letter in the mail two months in advance. If you don't receive a letter, please call our office to schedule the follow-up appointment.        If you need a refill on your cardiac medications before your next appointment, please call your pharmacy.

## 2017-05-15 NOTE — Assessment & Plan Note (Signed)
Stable weight now for the last 4 months.  Hopefully he can work on dietary adjustment and continue exercising as able lose weight now.  I suspect that that will help his lipids and blood pressure.  He has indicated a desire to adjust his diet, and is already started exercising.

## 2017-07-16 NOTE — Progress Notes (Deleted)
Patient ID: Joel Walters                 DOB: [birth year]                      MRN: [id number]     HPI: Joel Walters is a 67 y.o. male referred by Dr. Ellyn Walters to HTN clinic. PMH includes CAD s/p CABG, hypertension, upper GI bleed, and  pre-diabetes. Durong most recent OV with Dr Joel Walters this metoprolol tartrate was changed to metoprolol succinate to improve compliance. Losartan dose was unchanged at that time was plan to increase if needed for better BP control per MD's recommendation. Patient presents today for initial HTN clinic evuluation.   Current HTN meds:  Metoprolol succinate 50mg  daily Losartan 50mg  daily  Intolerance:  Lisinopril - cough  BP goal: <130/80  Family History: diabetes in his son  Social History: denies tobacco use; reports occasional alcohol intake  Diet:   Exercise:   Home BP readings:   Wt Readings from Last 3 Encounters:  05/15/17 256 lb 3.2 oz (116.2 kg)  03/10/17 256 lb (116.1 kg)  12/16/16 256 lb 9.6 oz (116.4 kg)   BP Readings from Last 3 Encounters:  05/15/17 (!) 156/88  12/16/16 (!) 150/88  09/02/15 129/82   Pulse Readings from Last 3 Encounters:  05/15/17 64  12/16/16 63  09/02/15 63    Past Medical History:  Diagnosis Date  . CAD, multiple vessel    MULTIVESSEL cad (mLAD (D1-past D2) 70-80% involving from Diags with ostial ~70%. pCx 70-80% (prior to Lat OM1) with normal AVG Cx& LPL w/ L-R Collaterals (small OM2 80-90%).  Large RI - normal. RCA moderate caliber ~99% (chronic subtotal CTO) --> s/p CABG x5   . GERD (gastroesophageal reflux disease)   . History of stomach ulcers   . Hypertension   . Prostate infection 2011  . 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 Truncal obesity   . Tumor of breast    REMOVED AT AGE 73 YEARS OLD  . Upper GI bleed 1980   PUD/ with blood transfusion    Current Outpatient Medications on File Prior to Visit  Medication Sig Dispense Refill  . aspirin EC 81 MG tablet Take 81  mg by mouth daily. 30 tablet 6  . Coenzyme Q10 (COQ10 PO) Take by mouth.    . ezetimibe (ZETIA) 10 MG tablet Take 1 tablet (10 mg total) by mouth daily. 90 tablet 3  . losartan (COZAAR) 50 MG tablet Take 50 mg by mouth daily.     . metoprolol succinate (TOPROL-XL) 50 MG 24 hr tablet Take 1 tablet (50 mg total) by mouth daily. Take with or immediately following a meal. 90 tablet 3  . Multiple Vitamins-Minerals (MULTIVITAMINS THER. W/MINERALS) TABS Take 1 tablet by mouth daily.     . pravastatin (PRAVACHOL) 20 MG tablet Take 1 tablet (20 mg total) by mouth every evening. 90 tablet 3   No current facility-administered medications on file prior to visit.     Allergies  Allergen Reactions  . Codeine     REACTION: Jittery, itches  . Crestor [Rosuvastatin] Other (See Comments)    "BROKE OUT PER PATIENT,HE STOPPED"  . Lipitor [Atorvastatin]     Cramping  . Lisinopril Cough    There were no vitals taken for this visit.  No problem-specific Assessment & Plan notes found for this encounter.  Joel Walters PharmD, BCPS, CPP  [place] [id number] 07/16/2017 7:05 PM

## 2017-07-17 ENCOUNTER — Ambulatory Visit: Payer: Medicare HMO

## 2017-08-29 ENCOUNTER — Telehealth: Payer: Self-pay | Admitting: Cardiology

## 2017-08-29 NOTE — Telephone Encounter (Signed)
Pt called earlier today about getting an RX for Zetia 10mg . Was told if he could find a place we would fill it. He found a community pharmacy called [facility] [phone] rx# [id number] talked to [facility]

## 2017-08-29 NOTE — Telephone Encounter (Signed)
New Message   Pt c/o medication issue:  1. Name of Medication: ezetimibe (ZETIA) 10 MG tablet(Expired)  2. How are you currently taking this medication (dosage and times per day)? Take 1 tablet (10 mg total) by mouth daily.  3. Are you having a reaction (difficulty breathing--STAT)? no  4. What is your medication issue? Pt states that when he tried to refill his prescription they told him it would be 190 for a 90 day supply but says it was way sheaper before and wants to know why or either be provided an alternative. Please call

## 2017-08-29 NOTE — Telephone Encounter (Signed)
Spoke with patient and his wife went to [facility] to pick up Ezetimibe and it was $190. He is not sure why cost is that much since last Rx was zero copay. Called and spoke with pharmacy and confirmed cost and there was no denial needing PA. Spoke with patient and gave him options of GoodRx and/or calling insurance company to find out why cost has increased. Patient will look into reason for change and call back if needs Rx sent anywhere differently.

## 2017-08-30 MED ORDER — EZETIMIBE 10 MG PO TABS: 10.0000 mg | ORAL_TABLET | Freq: Every day | ORAL | 2 refills | Status: AC

## 2017-08-30 NOTE — Telephone Encounter (Signed)
Called patient advised that medication sent to the pharmacy he requested. Patient advised to call us back if any other issues.

## 2018-05-27 ENCOUNTER — Other Ambulatory Visit: Payer: Self-pay | Admitting: Cardiology

## 2018-06-23 ENCOUNTER — Other Ambulatory Visit: Payer: Self-pay | Admitting: Cardiology

## 2018-06-25 NOTE — Telephone Encounter (Signed)
Pravachol refilled.  ?

## 2018-07-16 ENCOUNTER — Other Ambulatory Visit: Payer: Self-pay | Admitting: Cardiology

## 2018-07-20 ENCOUNTER — Other Ambulatory Visit: Payer: Self-pay | Admitting: Cardiology

## 2018-08-15 ENCOUNTER — Other Ambulatory Visit: Payer: Self-pay | Admitting: Cardiology

## 2018-08-29 ENCOUNTER — Other Ambulatory Visit: Payer: Self-pay | Admitting: Cardiology

## 2018-10-15 ENCOUNTER — Other Ambulatory Visit: Payer: Self-pay | Admitting: Cardiology

## 2018-10-21 ENCOUNTER — Other Ambulatory Visit: Payer: Self-pay | Admitting: Cardiology

## 2018-11-16 ENCOUNTER — Other Ambulatory Visit: Payer: Self-pay | Admitting: Cardiology

## 2018-11-16 NOTE — Telephone Encounter (Signed)
° ° ° °*  STAT* If patient is at the pharmacy, call can be transferred to refill team.   1. Which medications need to be refilled? (please list name of each medication and dose if known) pravastatin (PRAVACHOL) 20 MG tablet  2. Which pharmacy/location (including street and city if local pharmacy) is medication to [facility] sent to?[id number] [place] [place]  3. Do they need a 30 day or 90 day supply? [place]

## 2018-11-19 MED ORDER — PRAVASTATIN SODIUM 20 MG PO TABS: 20.0000 mg | ORAL_TABLET | Freq: Every day | ORAL | 0 refills | Status: DC

## 2018-12-15 ENCOUNTER — Other Ambulatory Visit: Payer: Self-pay | Admitting: Cardiology

## 2018-12-28 ENCOUNTER — Other Ambulatory Visit: Payer: Self-pay

## 2018-12-28 ENCOUNTER — Ambulatory Visit: Payer: Medicare HMO | Admitting: Cardiology

## 2018-12-28 VITALS — BP 175/90 | HR 65 | Ht 73.0 in | Wt 260.0 lb

## 2018-12-28 DIAGNOSIS — E785 Hyperlipidemia, unspecified: Secondary | ICD-10-CM

## 2018-12-28 DIAGNOSIS — Z951 Presence of aortocoronary bypass graft: Secondary | ICD-10-CM

## 2018-12-28 DIAGNOSIS — I2510 Atherosclerotic heart disease of native coronary artery without angina pectoris: Secondary | ICD-10-CM

## 2018-12-28 DIAGNOSIS — I10 Essential (primary) hypertension: Secondary | ICD-10-CM | POA: Diagnosis not present

## 2018-12-28 DIAGNOSIS — E669 Obesity, unspecified: Secondary | ICD-10-CM

## 2018-12-28 MED ORDER — SILDENAFIL CITRATE 20 MG PO TABS: ORAL_TABLET | ORAL | 5 refills | Status: DC

## 2018-12-28 NOTE — Progress Notes (Signed)
Primary Care Provider: [facility] [place] Cardiologist: No primary care provider on file. Electrophysiologist:   Clinic Note: Chief Complaint  Patient presents with  . Follow-up    Annual.  No complaints  . Coronary Artery Disease    HPI:    Joel Walters is a 68 y.o. male with a PMH below who presents today for ~18 month (delayed annual) f/u for CAD.   Kvon has known CAD with CABG.    He was initially evaluated for significant exertional dyspnea and dizziness with chest pressure.  abnormal Myoview impression large size (partially reversible moderate intensity perfusion defect in basal-apical inferoseptal and inferior wall -consistent with ischemia)-->   Cardiac Cath December 2012 => multivessel CAD -->   CABG x5 in December 2012 (LIMA TO LAD,SVG to D1, SVG to OM,seq SVG to RVM-rPDA), Dr. Servando Snare  follow-up Myoview in August 2013 was negative for ischemia.  Myoview March 10, 2017: Reached 92% of peak heart rate 144 bpm.  Good exercise tolerance.  EF 55-65%.  LOW RISK.  No ischemia or infarction.  The first year after his CABG he lost 30 pounds with diet and exercise and have blood pressure control. Was lost to follow-up from December 2014 until seen in November 2018.  Joel Walters was last seen in April 2019 to follow-up from Myoview stress test done for surveillance.  This showed no evidence of ischemia or infarction  Recent Hospitalizations: None  Reviewed  CV studies:    The following studies were reviewed today: (if available, images/films reviewed: From Epic Chart or Care Everywhere) . None:  Interval History:   Joel Walters returns here today really with no complaints.  He said he just got his labs checked that one of the local clinics and was told that everything looked "good ".  He says his blood pressure has not been an issue at all but is little concerned about the pressures today.  He had not yet taken his medications before he came  because he woke up a bit late. He does not have any PND orthopnea but he does wake up to urinate several times a night. About the only cardiovascular symptom that is positive is the fact that he has some erectile dysfunction issues.  Otherwise he is active does whatever he wants to do without any major issues. About the only other thing he notices that he has been dealing a lot with allergy issues.  Lots of congestion this year-round for him.  Makes it very hard to breathe at night.  CV Review of Symptoms (Summary)  no chest pain or dyspnea on exertion positive for - Erectile dysfunction negative for - edema, irregular heartbeat, orthopnea, palpitations, paroxysmal nocturnal dyspnea, rapid heart rate, shortness of breath or Headache, blurred vision, dizziness.  Syncope/near syncope, TIA/amaurosis fugax.  Claudication.  The patient does not have symptoms concerning for COVID-19 infection (fever, chills, cough, or new shortness of breath).  The patient is practicing social distancing. ++ (reluctantly) Masking.  Occasionally ventures out for Groceries/shopping.   REVIEWED OF SYSTEMS   A comprehensive ROS was performed. Review of Systems  Constitutional: Negative for malaise/fatigue.  HENT: Positive for congestion and sinus pain. Negative for nosebleeds.   Respiratory: Positive for cough (With allergies, but not otherwise). Negative for shortness of breath and wheezing.   Gastrointestinal: Negative for blood in stool, constipation, heartburn and melena.  Genitourinary: Negative for frequency and hematuria.       Nocturia  Musculoskeletal: Positive for  joint pain (Mild OA pains).  Neurological: Negative for dizziness and focal weakness.  Endo/Heme/Allergies: Negative for environmental allergies.  Psychiatric/Behavioral: Negative for memory loss. The patient is not nervous/anxious and does not have insomnia.    I have reviewed and (if needed) personally updated the patient's problem list,  medications, allergies, past medical and surgical history, social and family history.   PAST MEDICAL HISTORY   Past Medical History:  Diagnosis Date  . CAD, multiple vessel    MULTIVESSEL cad (mLAD (D1-past D2) 70-80% involving from Diags with ostial ~70%. pCx 70-80% (prior to Lat OM1) with normal AVG Cx& LPL w/ L-R Collaterals (small OM2 80-90%).  Large RI - normal. RCA moderate caliber ~99% (chronic subtotal CTO) --> s/p CABG x5   . GERD (gastroesophageal reflux disease)   . History of stomach ulcers   . Hypertension   . Prostate infection 2011  . 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 Truncal obesity   . Tumor of breast    REMOVED AT AGE 75 YEARS OLD  . Upper GI bleed 1980   PUD/ with blood transfusion    PAST SURGICAL HISTORY   Past Surgical History:  Procedure Laterality Date  . CAPSULAR RELEASE  01/05/2012   Procedure: CAPSULAR RELEASE;  Surgeon: Cammie Sickle., MD;  Location: [facility];  Service: Orthopedics;  Laterality: Right;  PIP CAPSULE RELEASE, FDS(FLEXOR DIGITORIUM SUPERFICIALIS) TENODESIS,  RIGHT SMALL RING FINGER,volar check rain ligament  . CORONARY ARTERY BYPASS GRAFT  02/11/2011   Procedure: CORONARY ARTERY BYPASS GRAFTING (CABG);  Surgeon: Grace Isaac, MD;  Location: [facility];  Service: Open Heart Surgery;  Laterality: N/A;;;LIMA TO LAD,SVG to D1, SVG to OM,SVG - RVM - PDA (SEQUENTIAL)  . DOPPLER ECHOCARDIOGRAPHY  02/09/2011  . INTRAOPERATIVE TEE  02/11/2011  . IRRIGATION AND DEBRIDEMENT SEBACEOUS CYST  age 28   left breast  . LEFT HEART CATHETERIZATION WITH CORONARY ANGIOGRAM N/A 02/02/2011   Procedure: LEFT HEART CATHETERIZATION WITH CORONARY ANGIOGRAM;  Surgeon: Leonie Man, MD;  Location: [facility] CATH LAB;; EF 60-65%;SEVERE 3 VESSEL DISEASE , mLAD (D1-past D2) 70-80% involving both Diags w/ ostial ~70%. pCx 70-80% (prior to Lat OM1-p50%)) with normal AVG Cx& LPL w/ L-R Collaterals (small OM2 80-90%).  Large RI -  normal. RCA moderate caliber ~99% (chronic subtotal CTO).  Marland Kitchen NASAL SINUS SURGERY  2010  . NM [facility] MULTIPLE W/SPECT  01/13/2011   showing large reversible basal inferoseptal,basal inferior,midinferior septal ,mid inferior,and apical inferior defect, likely due to the RCA lesion found on cathterization  . NM MYOCAR MULTIPLE W/SPECT  10/14/2011   a) EF 61%;  EXERCISE CAPACITY 10 METS, NO INDUCILE MYOCARDIAL ISCHEMIA; b) Reached 92% of peak heart rate 144 bpm.  Good exercise tolerance.  EF 55-65%.  LOW RISK.  No ischemia or infarction.  Marland Kitchen NM [facility] MULTIPLE W/SPECT  03/10/2017   Myoview March 10, 2017: Reached 92% of peak heart rate 144 bpm.  Good exercise tolerance.  EF 55-65%.  LOW RISK.  No ischemia or infarction.    MEDICATIONS/ALLERGIES   Current Meds  Medication Sig  . aspirin EC 81 MG tablet Take 81 mg by mouth daily.  . Coenzyme Q10 (COQ10 PO) Take by mouth.  . losartan (COZAAR) 50 MG tablet Take 50 mg by mouth daily.   . metoprolol succinate (TOPROL-XL) 50 MG 24 hr tablet Take 1 tablet (50 mg total) by mouth daily. OFFICE VISIT NEEDED BEFORE ADDITIONAL REFILLS  .  Multiple Vitamins-Minerals (MULTIVITAMINS THER. W/MINERALS) TABS Take 1 tablet by mouth daily.   . pravastatin (PRAVACHOL) 20 MG tablet Take 1 tablet (20 mg total) by mouth daily. Please keep upcoming appt for further refills. Thank you    Allergies  Allergen Reactions  . Codeine     REACTION: Jittery, itches  . Crestor [Rosuvastatin] Other (See Comments)    "BROKE OUT PER PATIENT,HE STOPPED"  . Lipitor [Atorvastatin]     Cramping  . Lisinopril Cough     SOCIAL HISTORY/FAMILY HISTORY   Social History   Tobacco Use  . Smoking status: Never Smoker  . Smokeless tobacco: Never Used  Substance Use Topics  . Alcohol use: Yes    Comment: occ  . Drug use: No   Social History   Social History Narrative   He is essentially now a "widower" as his long-term partner / "wife" was killed in a car accident this  spring.  He has been having a hard time dealing with it.  When this happened, he was in the middle of changing jobs, & was simply unable to handle the time restrictions & rigors of the new job with his grief.  He is currently no longer employed.    He has 3  children from his first marriage and 9 grandchildren.  With his grieving, he has not been exercising like he used to & has been putting back on all of the weight that he had lost post-operatively.    He has never smoked & does not drink Alcohol.    Family History family history includes Diabetes in his son. He was adopted.   OBJCTIVE -PE, EKG, labs   Wt Readings from Last 3 Encounters:  12/28/18 260 lb (117.9 kg)  05/15/17 256 lb 3.2 oz (116.2 kg)  03/10/17 256 lb (116.1 kg)    Physical Exam: BP (!) 175/90   Pulse 65   Ht 6\' 1"  (1.854 m)   Wt 260 lb (117.9 kg)   SpO2 96%   BMI 34.30 kg/m Initial blood pressure was 192/87, follow-up was per my check Physical Exam  Constitutional: He is oriented to person, place, and time. He appears well-developed and well-nourished. No distress.  Healthy-appearing.  Well-groomed.  HENT:  Head: Normocephalic and atraumatic.  Neck: Normal range of motion. Neck supple. No hepatojugular reflux and no JVD present. Carotid bruit is not present.  Cardiovascular: Normal rate, regular rhythm, normal heart sounds and normal pulses.  No extrasystoles are present. PMI is not displaced. Exam reveals no gallop and no friction rub.  No murmur heard. Pulmonary/Chest: Effort normal and breath sounds normal. No respiratory distress. He has no wheezes. He has no rales.  Abdominal: Soft. Bowel sounds are normal. He exhibits no distension. There is no abdominal tenderness.  No HSM  Musculoskeletal: Normal range of motion.        General: No edema.  Neurological: He is alert and oriented to person, place, and time.  Psychiatric: He has a normal mood and affect. His behavior is normal. Judgment and thought  content normal.  Vitals reviewed.   Adult ECG Report  Rate: 65 ;  Rhythm: normal sinus rhythm and Cannot rule out inferior MI, age undetermined.  Otherwise normal axis, intervals durations.;   Narrative Interpretation: Stable EKG  Recent Labs: Labs were just checked by his local clinic in [facility], will attempt to get labs.  Lab Results  Component Value Date   CREATININE 1.00 01/05/2012   BUN 24 (H) 01/05/2012  NA 141 01/05/2012   K 4.2 01/05/2012   CL 106 01/05/2012   CO2 32 02/14/2011    ASSESSMENT/PLAN    Problem List Items Addressed This Visit    S/P CABG (coronary artery bypass graft) (Chronic)   Relevant Orders   EKG 12-Lead   Hyperlipidemia with target low density lipoprotein (LDL) cholesterol less than 70 mg/dL (Chronic)    Unfortunately, it is hard to know what to do when I cannot find his labs.  We are to try to get the labs from his PCPs office.  Hopefully will have those results by the time he is seen in follow-up.  He is currently on 20 mg pravastatin along with Zetia.   If labs not available by time as he has follow-up visit next visit, would check at that time.  Should come in fasting. Would have low threshold to titrate from pravastatin to rosuvastatin, and consider PCSK9 inhibitor.      Relevant Medications   sildenafil (REVATIO) 20 MG tablet   Coronary artery disease involving native coronary artery without angina pectoris - Primary (Chronic)    Thankfully, he remains asymptomatic from a cardiac standpoint following his CABG.  Myoview last year was nonischemic.  Would not need to recheck again until January 2023-2024.  Currently he is on Toprol and losartan.  Anticipate 1 of those will be titrated further suspect losartan.  On aspirin along with statin and Zetia.  We will need closer blood pressure and lipid control.      Relevant Medications   sildenafil (REVATIO) 20 MG tablet   Other Relevant Orders   EKG 12-Lead   Essential hypertension (Chronic)     Blood pressure is still poorly controlled.  However he said he had not taken his medications yet today. Plan for today will be for him to take his next dose of losartan and then an additional dose of Toprol tonight.  I have asked that he follow-up his blood pressures at home if he can and then will have him come back in 2 to 3 months to reevaluate.  If lipids are poorly controlled and blood pressure is well controlled, but probably have him come in to [facility] to discuss treatment options with our clinical pharmacist after his short follow-up with an APP.      Relevant Medications   sildenafil (REVATIO) 20 MG tablet   Other Relevant Orders   EKG 12-Lead   Obesity (BMI 30-39.9) (Chronic)    Unfortunately, he has gained a little bit of weight during the COVID-19 timeframe.  We talked about getting back active and exercising.  We discussed that with dietary changes and exercising, he should hopefully be able to lose more weight.  Hopefully this will help his blood pressure as well.          COVID-19 Education: The signs and symptoms of COVID-19 were discussed with the patient and how to seek care for testing (follow up with PCP or arrange E-visit).   The importance of social distancing was discussed today.  I spent a total of 18 minutes with the patient and chart review. >  50% of the time was spent in direct patient consultation.  Additional time spent with chart review (studies, outside notes, etc): 6 Total Time: 24 min   Current medicines are reviewed at length with the patient today.  (+/- concerns) asked about medications for ED.   Patient Instructions / Medication Changes & Studies & Tests Ordered   Patient Instructions  Medication Instructions:  CHECK BLOOD PRESSURE   AND RECORD READINGS --   DOUBLE  LOSARTAN TODAY ONLY   TONIGHT TAKE  ADDITIONAL TOPROL    TOMORROW TAKE LOSARTAN  *If you need a refill on your cardiac medications before your next appointment, please call  your pharmacy*  Lab Work: NOT NEEDED   Testing/Procedures: Not needed  Follow-Up: At [facility], you and your health needs are our priority.  As part of our continuing mission to provide you with exceptional heart care, we have created designated Provider Care Teams.  These Care Teams include your primary Cardiologist (physician) and Advanced Practice Providers (APPs -  Physician Assistants and Nurse Practitioners) who all work together to provide you with the care you need, when you need it.  Your next appointment:   2-3 month visit   The format for your next appointment:   Virtual Visit   Provider:   Jory Sims  Other Instructions     Studies Ordered:   Orders Placed This Encounter  Procedures  . EKG 12-Lead     Glenetta Hew, M.D., M.S. Interventional Cardiologist   Pager # [phone] Phone # [phone] [address]. [place], [place] [address]   Thank you for choosing Heart[facility] at [facility]!!

## 2018-12-28 NOTE — Patient Instructions (Addendum)
Medication Instructions:  CHECK BLOOD PRESSURE   AND RECORD READINGS --   DOUBLE  LOSARTAN TODAY ONLY   TONIGHT TAKE  ADDITIONAL TOPROL    TOMORROW TAKE LOSARTAN  *If you need a refill on your cardiac medications before your next appointment, please call your pharmacy*  Lab Work: NOT NEEDED   Testing/Procedures: Not needed  Follow-Up: At [facility], you and your health needs are our priority.  As part of our continuing mission to provide you with exceptional heart care, we have created designated Provider Care Teams.  These Care Teams include your primary Cardiologist (physician) and Advanced Practice Providers (APPs -  Physician Assistants and Nurse Practitioners) who all work together to provide you with the care you need, when you need it.  Your next appointment:   2-3 month visit   The format for your next appointment:   Virtual Visit   Provider:   Jory Sims  Other Instructions

## 2018-12-30 ENCOUNTER — Encounter: Payer: Self-pay | Admitting: Cardiology

## 2018-12-30 NOTE — Assessment & Plan Note (Addendum)
Unfortunately, it is hard to know what to do when I cannot find his labs.  We are to try to get the labs from his PCPs office.  Hopefully will have those results by the time he is seen in follow-up.  He is currently on 20 mg pravastatin along with Zetia.   If labs not available by time as he has follow-up visit next visit, would check at that time.  Should come in fasting. Would have low threshold to titrate from pravastatin to rosuvastatin, and consider PCSK9 inhibitor.

## 2018-12-30 NOTE — Assessment & Plan Note (Signed)
Blood pressure is still poorly controlled.  However he said he had not taken his medications yet today. Plan for today will be for him to take his next dose of losartan and then an additional dose of Toprol tonight.  I have asked that he follow-up his blood pressures at home if he can and then will have him come back in 2 to 3 months to reevaluate.  If lipids are poorly controlled and blood pressure is well controlled, but probably have him come in to [facility] to discuss treatment options with our clinical pharmacist after his short follow-up with an APP.

## 2018-12-30 NOTE — Assessment & Plan Note (Signed)
Thankfully, he remains asymptomatic from a cardiac standpoint following his CABG.  Myoview last year was nonischemic.  Would not need to recheck again until January 2023-2024.  Currently he is on Toprol and losartan.  Anticipate 1 of those will be titrated further suspect losartan.  On aspirin along with statin and Zetia.  We will need closer blood pressure and lipid control.

## 2018-12-30 NOTE — Assessment & Plan Note (Signed)
Unfortunately, he has gained a little bit of weight during the COVID-19 timeframe.  We talked about getting back active and exercising.  We discussed that with dietary changes and exercising, he should hopefully be able to lose more weight.  Hopefully this will help his blood pressure as well.

## 2019-01-11 ENCOUNTER — Other Ambulatory Visit: Payer: Self-pay | Admitting: Cardiology

## 2019-02-18 ENCOUNTER — Ambulatory Visit: Payer: Medicare HMO | Attending: Internal Medicine

## 2019-02-18 DIAGNOSIS — Z20822 Contact with and (suspected) exposure to covid-19: Secondary | ICD-10-CM

## 2019-02-20 ENCOUNTER — Telehealth: Payer: Self-pay

## 2019-02-20 LAB — NOVEL CORONAVIRUS, NAA: SARS-CoV-2, NAA: NOT DETECTED

## 2019-02-20 NOTE — Telephone Encounter (Signed)
Caller advise result not back yet 

## 2019-04-03 NOTE — Progress Notes (Signed)
Virtual Visit via Telephone Note   This visit type was conducted due to national recommendations for restrictions regarding the COVID-19 Pandemic (e.g. social distancing) in an effort to limit this patient's exposure and mitigate transmission in our community.  Due to his co-morbid illnesses, this patient is at least at moderate risk for complications without adequate follow up.  This format is felt to be most appropriate for this patient at this time.  The patient did not have access to video technology/had technical difficulties with video requiring transitioning to audio format only (telephone).  All issues noted in this document were discussed and addressed.  No physical exam could be performed with this format.  Please refer to the patient's chart for his  consent to telehealth for [facility].   Date:  04/04/2019   ID:  Joel Walters, DOB 07/06/1950, MRN [id number]  Patient Location: Home Provider Location: Home  PCP:  [facility] [place]  Cardiologist:  Glenetta Hew, MD  Electrophysiologist:  None   Evaluation Performed:  Follow-Up Visit  Chief Complaint:  Follow up   History of Present Illness:    Joel Walters is a 69 y.o. male we are following for ongoing assessment and management of CAD, CABG X 5 in December 2012 (LIMA to LAD, SVG to D1,SVG to OM, Seg SVG to RVM-rPDA); HTN, HL, LDL < 70 goal,  GERD, hx of UGI bleed.  Was last seen by Dr. Ellyn Hack on 12/28/2018 via telemedicine. BP was not well controlled. He had not taken his medications at that time and was found to be hypertensive.   He is without complaint, walks on the treadmill, and outside with his job a lot. BP is better at home.  Labs are followed by PCP.  He offers no complaints of chest pain, DOE, diaphoresis or decreased energy. He is medically compliant.   The patient does have symptoms concerning for COVID-19 infection (fever, chills, cough, or new shortness of breath). He is interested in  getting COVID 19 vaccine.    Past Medical History:  Diagnosis Date  . CAD, multiple vessel    MULTIVESSEL cad (mLAD (D1-past D2) 70-80% involving from Diags with ostial ~70%. pCx 70-80% (prior to Lat OM1) with normal AVG Cx& LPL w/ L-R Collaterals (small OM2 80-90%).  Large RI - normal. RCA moderate caliber ~99% (chronic subtotal CTO) --> s/p CABG x5   . GERD (gastroesophageal reflux disease)   . History of stomach ulcers   . Hypertension   . Prostate infection 2011  . 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 Truncal obesity   . Tumor of breast    REMOVED AT AGE 7 YEARS OLD  . Upper GI bleed 1980   PUD/ with blood transfusion   Past Surgical History:  Procedure Laterality Date  . CAPSULAR RELEASE  01/05/2012   Procedure: CAPSULAR RELEASE;  Surgeon: Cammie Sickle., MD;  Location: [place];  Service: Orthopedics;  Laterality: Right;  PIP CAPSULE RELEASE, FDS(FLEXOR DIGITORIUM SUPERFICIALIS) TENODESIS,  RIGHT SMALL RING FINGER,volar check rain ligament  . CORONARY ARTERY BYPASS GRAFT  02/11/2011   Procedure: CORONARY ARTERY BYPASS GRAFTING (CABG);  Surgeon: Grace Isaac, MD;  Location: [place];  Service: Open Heart Surgery;  Laterality: N/A;;;LIMA TO LAD,SVG to D1, SVG to OM,SVG - RVM - PDA (SEQUENTIAL)  . DOPPLER ECHOCARDIOGRAPHY  02/09/2011  . INTRAOPERATIVE TEE  02/11/2011  . IRRIGATION AND DEBRIDEMENT SEBACEOUS CYST  age  18   left breast  . LEFT HEART CATHETERIZATION WITH CORONARY ANGIOGRAM N/A 02/02/2011   Procedure: LEFT HEART CATHETERIZATION WITH CORONARY ANGIOGRAM;  Surgeon: Leonie Man, MD;  Location[facility] CATH LAB;; EF 60-65%;SEVERE 3 VESSEL DISEASE , mLAD (D1-past D2) 70-80% involving both Diags w/ ostial ~70%. pCx 70-80% (prior to Lat OM1-p50%)) with normal AVG Cx& LPL w/ L-R Collaterals (small OM2 80-90%).  Large RI - normal. RCA moderate caliber ~99% (chronic subtotal CTO).  Marland Kitchen NASAL SINUS SURGERY  2010  . [facility] MULTIPLE  W/SPECT  01/13/2011   showing large reversible basal inferoseptal,basal inferior,midinferior septal ,mid inferior,and apical inferior defect, likely due to the RCA lesion found on cathterization  . NM MYOCAR MULTIPLE W/SPECT  10/14/2011   a) EF 61%;  EXERCISE CAPACITY 10 METS, NO INDUCILE MYOCARDIAL ISCHEMIA; b) Reached 92% of peak heart rate 144 bpm.  Good exercise tolerance.  EF 55-65%.  LOW RISK.  No ischemia or infarction.  Marland Kitchen [facility] MULTIPLE W/SPECT  03/10/2017   Myoview March 10, 2017: Reached 92% of peak heart rate 144 bpm.  Good exercise tolerance.  EF 55-65%.  LOW RISK.  No ischemia or infarction.     Current Meds  Medication Sig  . aspirin EC 81 MG tablet Take 81 mg by mouth daily.  . Coenzyme Q10 (COQ10 PO) Take by mouth.  . ezetimibe (ZETIA) 10 MG tablet Take 1 tablet (10 mg total) by mouth daily.  . fluticasone (FLONASE) 50 MCG/ACT nasal spray Place 2 sprays into both nostrils daily.  . metoprolol succinate (TOPROL-XL) 50 MG 24 hr tablet Take 1 tablet (50 mg total) by mouth daily. OFFICE VISIT NEEDED BEFORE ADDITIONAL REFILLS  . Multiple Vitamins-Minerals (MULTIVITAMINS THER. W/MINERALS) TABS Take 1 tablet by mouth daily.   . pravastatin (PRAVACHOL) 20 MG tablet TAKE 1 TABLET BY MOUTH EVERY DAY  . sildenafil (REVATIO) 20 MG tablet Take 1 to 4 tablet as needed for erectile dysfunction     Allergies:   Codeine, Crestor [rosuvastatin], Lipitor [atorvastatin], and Lisinopril   Social History   Tobacco Use  . Smoking status: Never Smoker  . Smokeless tobacco: Never Used  Substance Use Topics  . Alcohol use: Yes    Comment: occ  . Drug use: No     Family Hx: The patient's family history includes Diabetes in his son. There is no history of Anesthesia problems, Hypotension, Malignant hyperthermia, or Pseudochol deficiency. He was adopted.  ROS:   Please see the history of present illness.    All other systems reviewed and are negative.   Prior CV studies:   The  following studies were reviewed today:  He was initially evaluated for significant exertional dyspnea and dizziness with chest pressure.  abnormal Myoview impression large size (partially reversible moderate intensity perfusion defect in basal-apical inferoseptal and inferior wall -consistent with ischemia)--> ? Cardiac Cath December 2012 =>multivessel CAD --> ? CABG x5 in December 2012 (LIMA TO LAD,SVG to D1, SVG to OM,seq SVG to RVM-rPDA), Dr. Servando Snare  follow-up Myoview in August 2013 was negative for ischemia.  Myoview March 10, 2017: Reached 92% of peak heart rate 144 bpm. Good exercise tolerance. EF 55-65%. LOW RISK. No ischemia or infarction.  Labs/Other Tests and Data Reviewed:    EKG:  No ECG reviewed.  Recent Labs: No results found for requested labs within last 8760 hours.   Recent Lipid Panel Lab Results  Component Value Date/Time   CHOL 208 (H) 04/10/2009 08:33 AM   TRIG 117.0  04/10/2009 08:33 AM   HDL 36.30 (L) 04/10/2009 08:33 AM   CHOLHDL 6 04/10/2009 08:33 AM   LDLCALC 142 (H) 08/03/2007 09:56 AM   LDLDIRECT 158.6 04/10/2009 08:33 AM    Wt Readings from Last 3 Encounters:  04/04/19 255 lb (115.7 kg)  12/28/18 260 lb (117.9 kg)  05/15/17 256 lb 3.2 oz (116.2 kg)     Objective:    Vital Signs:  BP 133/74   Ht 6\' 1"  (1.854 m)   Wt 255 lb (115.7 kg)   BMI 33.64 kg/m    Limited Assessment via telephone visit.   ASSESSMENT & PLAN:    1. CAD: Hx of CABG in 2012 (LIMA to LAD, SVG to D1, SVG to OM, Seg SVG to RVM-rPDA).  He continues on metoprolol, ASA and statin therapy. He offers no complaints from cardiac standpoint. Will refill his metoprolol for 90 days.   2. Hypertension: BP is well controlled today. Continue BB only. Not on ACE due to intolerance causing frequent coughing.   3. Hyperlipidemia: Goal of LDL < 70.  He will continue on pravastatin and ezetimibe as directed. Labs per PCP.  COVID-19 Education: The signs and symptoms of COVID-19  were discussed with the patient and how to seek care for testing (follow up with PCP or arrange E-visit). The importance of social distancing was discussed today. I have given him information on COVID vaccine appointments.   Time:   Today, I have spent 30 minutes with the patient with telehealth technology discussing the above problems.     Medication Adjustments/Labs and Tests Ordered: Current medicines are reviewed at length with the patient today.  Concerns regarding medicines are outlined above.   Tests Ordered: No orders of the defined types were placed in this encounter.   Medication Changes: No orders of the defined types were placed in this encounter.   Disposition:  Follow up 1 year   Signed, Phill Myron. West Pugh, ANP, AACC  04/04/2019 10:15 AM    [facility]

## 2019-04-04 ENCOUNTER — Encounter: Payer: Self-pay | Admitting: Adult Health

## 2019-04-04 ENCOUNTER — Telehealth (INDEPENDENT_AMBULATORY_CARE_PROVIDER_SITE_OTHER): Payer: Medicare HMO | Admitting: Adult Health

## 2019-04-04 VITALS — BP 133/74 | Ht 73.0 in | Wt 255.0 lb

## 2019-04-04 DIAGNOSIS — I10 Essential (primary) hypertension: Secondary | ICD-10-CM

## 2019-04-04 DIAGNOSIS — Z951 Presence of aortocoronary bypass graft: Secondary | ICD-10-CM

## 2019-04-04 DIAGNOSIS — I2510 Atherosclerotic heart disease of native coronary artery without angina pectoris: Secondary | ICD-10-CM

## 2019-04-04 DIAGNOSIS — E7800 Pure hypercholesterolemia, unspecified: Secondary | ICD-10-CM

## 2019-04-04 MED ORDER — METOPROLOL SUCCINATE ER 50 MG PO TB24: 50.0000 mg | ORAL_TABLET | Freq: Every day | ORAL | 3 refills | Status: AC

## 2019-04-04 NOTE — Patient Instructions (Signed)
Medication Instructions:  Continue current medications  *If you need a refill on your cardiac medications before your next appointment, please call your pharmacy*  Lab Work: None Ordered  Testing/Procedures: None Ordered  Follow-Up: At [facility], you and your health needs are our priority.  As part of our continuing mission to provide you with exceptional heart care, we have created designated Provider Care Teams.  These Care Teams include your primary Cardiologist (physician) and Advanced Practice Providers (APPs -  Physician Assistants and Nurse Practitioners) who all work together to provide you with the care you need, when you need it.  Your next appointment:   1 year(s)  The format for your next appointment:   In Person  Provider:   You may see Glenetta Hew, MD or one of the following Advanced Practice Providers on your designated Care Team:    Rosaria Ferries, PA-C  Jory Sims, DNP, ANP  Cadence Kathlen Mody, NP

## 2019-04-12 ENCOUNTER — Other Ambulatory Visit: Payer: Self-pay

## 2019-04-12 ENCOUNTER — Ambulatory Visit: Payer: Medicare HMO | Attending: Internal Medicine

## 2019-04-12 DIAGNOSIS — Z23 Encounter for immunization: Secondary | ICD-10-CM

## 2019-04-12 NOTE — Progress Notes (Signed)
   Covid-19 Vaccination Clinic  Name:  Joel Walters    MRN: [id number] DOB: [birth year]  04/12/2019  [name] was observed post Covid-19 immunization for 15 minutes without incidence. He was provided with Vaccine Information Sheet and instruction to access the V-Safe system.   [name] was instructed to call 911 with any severe reactions post vaccine: Marland Kitchen Difficulty breathing  . Swelling of your face and throat  . A fast heartbeat  . A bad rash all over your body  . Dizziness and weakness    Immunizations Administered    Name Date Dose VIS Date Route   Pfizer COVID-19 Vaccine 04/12/2019  9:59 AM 0.3 mL 01/25/2019 Intramuscular   Manufacturer: [place]   Lot: [id number]   [place]: [id number]

## 2019-04-12 NOTE — Progress Notes (Signed)
   Covid-19 Vaccination Clinic  Name:  Joel Walters    MRN: [id number] DOB: 03-10-1950  04/12/2019  [name] was observed post Covid-19 immunization for 15 minutes without incidence. He was provided with Vaccine Information Sheet and instruction to access the V-Safe system.   [name] was instructed to call 911 with any severe reactions post vaccine: Marland Kitchen Difficulty breathing  . Swelling of your face and throat  . A fast heartbeat  . A bad rash all over your body  . Dizziness and weakness    Immunizations Administered    Name Date Dose VIS Date Route   Pfizer COVID-19 Vaccine 04/12/2019  9:59 AM 0.3 mL 01/25/2019 Intramuscular   Manufacturer: [place]   Lot: [id number]   [place]: [id number]

## 2019-05-08 ENCOUNTER — Ambulatory Visit: Payer: Medicare HMO | Attending: Internal Medicine

## 2019-05-08 DIAGNOSIS — Z23 Encounter for immunization: Secondary | ICD-10-CM

## 2019-05-08 NOTE — Progress Notes (Signed)
   Covid-19 Vaccination Clinic  Name:  Joel Walters    MRN: [id number] DOB: 12-18-1950  05/08/2019  [name] was observed post Covid-19 immunization for 15 minutes without incident. He was provided with Vaccine Information Sheet and instruction to access the V-Safe system.   Mr. Peri was instructed to call 911 with any severe reactions post vaccine: Marland Kitchen Difficulty breathing  . Swelling of face and throat  . A fast heartbeat  . A bad rash all over body  . Dizziness and weakness   Immunizations Administered    Name Date Dose VIS Date Route   Pfizer COVID-19 Vaccine 05/08/2019  1:09 PM 0.3 mL 01/25/2019 Intramuscular   Manufacturer: [place]   Lot: [id number]   [place]: [id number]

## 2019-05-23 ENCOUNTER — Ambulatory Visit: Payer: Self-pay | Admitting: Urology

## 2019-05-30 ENCOUNTER — Other Ambulatory Visit: Payer: Self-pay

## 2019-05-30 ENCOUNTER — Ambulatory Visit (INDEPENDENT_AMBULATORY_CARE_PROVIDER_SITE_OTHER): Payer: Self-pay | Admitting: Gastroenterology

## 2019-05-30 DIAGNOSIS — Z1211 Encounter for screening for malignant neoplasm of colon: Secondary | ICD-10-CM

## 2019-05-30 NOTE — Progress Notes (Signed)
Gastroenterology Pre-Procedure Review  Request Date: Thursday Requesting Physician: Dr. Marius Ditch  PATIENT REVIEW QUESTIONS: The patient responded to the following health history questions as indicated:    1. Are you having any GI issues? no 2. Do you have a personal history of Polyps? no 3. Do you have a family history of Colon Cancer or Polyps? no 4. Diabetes Mellitus? no 5. Joint replacements in the past 12 months?no 6. Major health problems in the past 3 months?no 7. Any artificial heart valves, MVP, or defibrillator?no    MEDICATIONS & ALLERGIES:    Patient reports the following regarding taking any anticoagulation/antiplatelet therapy:   Plavix, Coumadin, Eliquis, Xarelto, Lovenox, Pradaxa, Brilinta, or Effient? no Aspirin? no  Patient confirms/reports the following medications:  Current Outpatient Medications  Medication Sig Dispense Refill  . aspirin EC 81 MG tablet Take 81 mg by mouth daily. 30 tablet 6  . fluticasone (FLONASE) 50 MCG/ACT nasal spray Place 2 sprays into both nostrils daily.    Marland Kitchen losartan (COZAAR) 50 MG tablet Take 50 mg by mouth daily.    . metoprolol succinate (TOPROL-XL) 50 MG 24 hr tablet Take 1 tablet (50 mg total) by mouth daily. 90 tablet 3  . Multiple Vitamins-Minerals (MULTIVITAMINS THER. W/MINERALS) TABS Take 1 tablet by mouth daily.     . pravastatin (PRAVACHOL) 20 MG tablet TAKE 1 TABLET BY MOUTH EVERY DAY 30 tablet 8  . Probiotic Product (PROBIOTIC DAILY PO) Take by mouth.    . TURMERIC PO Take by mouth.    . Coenzyme Q10 (COQ10 PO) Take by mouth.    . ezetimibe (ZETIA) 10 MG tablet Take 1 tablet (10 mg total) by mouth daily. 90 tablet 2  . sildenafil (REVATIO) 20 MG tablet Take 1 to 4 tablet as needed for erectile dysfunction (Patient not taking: Reported on 05/30/2019) 12 tablet 5   No current facility-administered medications for this visit.    Patient confirms/reports the following allergies:  Allergies  Allergen Reactions  . Codeine    REACTION: Jittery, itches  . Crestor [Rosuvastatin] Other (See Comments)    "BROKE OUT PER PATIENT,HE STOPPED"  . Lipitor [Atorvastatin]     Cramping  . Lisinopril Cough    No orders of the defined types were placed in this encounter.   AUTHORIZATION INFORMATION Primary Insurance: 1D#: Group #:  Secondary Insurance: 1D#: Group #:  SCHEDULE INFORMATION: Date: Thursday 06/20/19 Time: Location:[facility]

## 2019-06-18 ENCOUNTER — Other Ambulatory Visit
Admission: RE | Admit: 2019-06-18 | Discharge: 2019-06-18 | Disposition: A | Discharge status: Home / Self Care | Payer: Medicare HMO | Source: Ambulatory Visit | Attending: Gastroenterology | Admitting: Gastroenterology

## 2019-06-18 ENCOUNTER — Other Ambulatory Visit: Payer: Self-pay

## 2019-06-18 DIAGNOSIS — Z01812 Encounter for preprocedural laboratory examination: Secondary | ICD-10-CM | POA: Diagnosis present

## 2019-06-18 DIAGNOSIS — Z20822 Contact with and (suspected) exposure to covid-19: Secondary | ICD-10-CM | POA: Diagnosis not present

## 2019-06-18 LAB — SARS CORONAVIRUS 2 (TAT 6-24 HRS): SARS Coronavirus 2: NEGATIVE

## 2019-06-19 ENCOUNTER — Encounter: Payer: Self-pay | Admitting: Gastroenterology

## 2019-06-20 ENCOUNTER — Other Ambulatory Visit: Payer: Self-pay

## 2019-06-20 ENCOUNTER — Ambulatory Visit: Payer: Medicare HMO | Admitting: Anesthesiology

## 2019-06-20 ENCOUNTER — Ambulatory Visit: Payer: Medicare HMO | Admitting: Urology

## 2019-06-20 ENCOUNTER — Encounter
Admission: RE | Disposition: A | Discharge status: Home / Self Care | Payer: Self-pay | Source: Home / Self Care | Attending: Gastroenterology

## 2019-06-20 ENCOUNTER — Ambulatory Visit
Admission: RE | Admit: 2019-06-20 | Discharge: 2019-06-20 | Disposition: A | Discharge status: Home / Self Care | Payer: Medicare HMO | Attending: Gastroenterology | Admitting: Gastroenterology

## 2019-06-20 ENCOUNTER — Encounter: Payer: Self-pay | Admitting: Gastroenterology

## 2019-06-20 ENCOUNTER — Encounter: Payer: Self-pay | Admitting: Urology

## 2019-06-20 VITALS — BP 162/80 | HR 80 | Ht 73.0 in | Wt 258.0 lb

## 2019-06-20 DIAGNOSIS — F329 Major depressive disorder, single episode, unspecified: Secondary | ICD-10-CM | POA: Insufficient documentation

## 2019-06-20 DIAGNOSIS — R351 Nocturia: Secondary | ICD-10-CM

## 2019-06-20 DIAGNOSIS — I10 Essential (primary) hypertension: Secondary | ICD-10-CM | POA: Insufficient documentation

## 2019-06-20 DIAGNOSIS — Z79899 Other long term (current) drug therapy: Secondary | ICD-10-CM | POA: Insufficient documentation

## 2019-06-20 DIAGNOSIS — Z6833 Body mass index (BMI) 33.0-33.9, adult: Secondary | ICD-10-CM | POA: Diagnosis not present

## 2019-06-20 DIAGNOSIS — Z1211 Encounter for screening for malignant neoplasm of colon: Secondary | ICD-10-CM | POA: Diagnosis not present

## 2019-06-20 DIAGNOSIS — Z87891 Personal history of nicotine dependence: Secondary | ICD-10-CM | POA: Diagnosis not present

## 2019-06-20 DIAGNOSIS — R350 Frequency of micturition: Secondary | ICD-10-CM | POA: Diagnosis not present

## 2019-06-20 DIAGNOSIS — D124 Benign neoplasm of descending colon: Secondary | ICD-10-CM | POA: Insufficient documentation

## 2019-06-20 DIAGNOSIS — D120 Benign neoplasm of cecum: Secondary | ICD-10-CM | POA: Diagnosis not present

## 2019-06-20 DIAGNOSIS — I2510 Atherosclerotic heart disease of native coronary artery without angina pectoris: Secondary | ICD-10-CM | POA: Diagnosis not present

## 2019-06-20 DIAGNOSIS — Z951 Presence of aortocoronary bypass graft: Secondary | ICD-10-CM | POA: Insufficient documentation

## 2019-06-20 DIAGNOSIS — K219 Gastro-esophageal reflux disease without esophagitis: Secondary | ICD-10-CM | POA: Insufficient documentation

## 2019-06-20 DIAGNOSIS — E669 Obesity, unspecified: Secondary | ICD-10-CM | POA: Insufficient documentation

## 2019-06-20 DIAGNOSIS — Z7982 Long term (current) use of aspirin: Secondary | ICD-10-CM | POA: Insufficient documentation

## 2019-06-20 DIAGNOSIS — D122 Benign neoplasm of ascending colon: Secondary | ICD-10-CM | POA: Insufficient documentation

## 2019-06-20 HISTORY — PX: COLONOSCOPY WITH PROPOFOL: SHX5780

## 2019-06-20 LAB — MICROSCOPIC EXAMINATION
Bacteria, UA: NONE SEEN
Epithelial Cells (non renal): NONE SEEN /hpf (ref 0–10)
RBC, Urine: NONE SEEN /hpf (ref 0–2)

## 2019-06-20 LAB — URINALYSIS, COMPLETE
Bilirubin, UA: NEGATIVE
Glucose, UA: NEGATIVE
Ketones, UA: NEGATIVE
Leukocytes,UA: NEGATIVE
Nitrite, UA: NEGATIVE
Protein,UA: NEGATIVE
RBC, UA: NEGATIVE
Specific Gravity, UA: 1.025 (ref 1.005–1.030)
Urobilinogen, Ur: 0.2 mg/dL (ref 0.2–1.0)
pH, UA: 7 (ref 5.0–7.5)

## 2019-06-20 LAB — BLADDER SCAN AMB NON-IMAGING

## 2019-06-20 SURGERY — COLONOSCOPY WITH PROPOFOL
Anesthesia: General

## 2019-06-20 MED ORDER — LIDOCAINE 2% (20 MG/ML) 5 ML SYRINGE
INTRAMUSCULAR | Status: DC | PRN
  Administered 2019-06-20: 60 mg via INTRAVENOUS

## 2019-06-20 MED ORDER — PROPOFOL 500 MG/50ML IV EMUL
INTRAVENOUS | Status: DC | PRN
  Administered 2019-06-20: 150 ug/kg/min via INTRAVENOUS

## 2019-06-20 MED ORDER — SODIUM CHLORIDE 0.9 % IV SOLN: INTRAVENOUS | Status: DC

## 2019-06-20 MED ORDER — PROPOFOL 500 MG/50ML IV EMUL
INTRAVENOUS | Status: AC
  Filled 2019-06-20: qty 50

## 2019-06-20 MED ORDER — PROPOFOL 10 MG/ML IV BOLUS
INTRAVENOUS | Status: DC | PRN
  Administered 2019-06-20: 100 mg via INTRAVENOUS

## 2019-06-20 MED ORDER — LIDOCAINE HCL (PF) 2 % IJ SOLN
INTRAMUSCULAR | Status: AC
  Filled 2019-06-20: qty 10

## 2019-06-20 NOTE — Anesthesia Postprocedure Evaluation (Signed)
Anesthesia Post Note  Patient: Joel Walters  Procedure(s) Performed: COLONOSCOPY WITH PROPOFOL (N/A )  Patient location during evaluation: Endoscopy Anesthesia Type: General Level of consciousness: awake and alert Pain management: pain level controlled Vital Signs Assessment: post-procedure vital signs reviewed and stable Respiratory status: spontaneous breathing, nonlabored ventilation, respiratory function stable and patient connected to nasal cannula oxygen Cardiovascular status: blood pressure returned to baseline and stable Postop Assessment: no apparent nausea or vomiting Anesthetic complications: no     Last Vitals:  Vitals:   06/20/19 0900 06/20/19 0910  BP: (!) 88/56 128/79  Pulse: 71 72  Resp:  18  Temp: 36.7 C   SpO2: 100% 100%    Last Pain:  Vitals:   06/20/19 0900  TempSrc: Temporal                 Arita Miss

## 2019-06-20 NOTE — Anesthesia Preprocedure Evaluation (Signed)
Anesthesia Evaluation  Patient identified by MRN, date of birth, ID band Patient awake    Reviewed: Allergy & Precautions, NPO status , Patient's Chart, lab work & pertinent test results  History of Anesthesia Complications Negative for: history of anesthetic complications  Airway Mallampati: II  TM Distance: >3 FB Neck ROM: Full   Comment: beard Dental no notable dental hx. (+) Edentulous Upper   Pulmonary neg pulmonary ROS, neg sleep apnea, neg COPD, Patient abstained from smoking.Not current smoker,    Pulmonary exam normal breath sounds clear to auscultation       Cardiovascular Exercise Tolerance: Good METShypertension, Pt. on medications + CAD and + CABG  (-) Past MI (-) dysrhythmias  Rhythm:Regular Rate:Normal - Systolic murmurs Stress test 2019:  The patient walked on a standard Bruce protocol treadmill test for 8 minutes and 40 seconds. The peak heart rate was 144 which is 92% predicted maximal heart rate.  There were no ST or T wave changes to suggest ischemia. The blood pressure response to exercise was normal.  Nuclear stress EF: 59%. The left ventricular ejection fraction is normal (55-65%).  This is a low risk study.  The study is normal. There is no evidence of ischemia or previous infarction   Neuro/Psych PSYCHIATRIC DISORDERS Depression negative neurological ROS     GI/Hepatic PUD, GERD  Controlled,(+)     (-) substance abuse  ,   Endo/Other  neg diabetes  Renal/GU negative Renal ROS     Musculoskeletal   Abdominal   Peds  Hematology   Anesthesia Other Findings Past Medical History: No date: CAD, multiple vessel     Comment:  MULTIVESSEL cad (mLAD (D1-past D2) 70-80% involving from              Diags with ostial ~70%. pCx 70-80% (prior to Lat OM1)               with normal AVG Cx& LPL w/ L-R Collaterals (small OM2               80-90%).  Large RI - normal. RCA moderate caliber ~99%           (chronic subtotal CTO) --> s/p CABG x5  No date: GERD (gastroesophageal reflux disease) No date: History of stomach ulcers No date: Hypertension 2011: Prostate infection 01/2011: S/P CABG x 5     Comment:  LIMA-LAD, SVG-D1, SVG-OM, SVG-RVM-PDA No date: Truncal obesity No date: Tumor of breast     Comment:  REMOVED AT AGE 69 YEARS OLD 1980: Upper GI bleed     Comment:  PUD/ with blood transfusion  Reproductive/Obstetrics                             Anesthesia Physical Anesthesia Plan  ASA: III  Anesthesia Plan: General   Post-op Pain Management:    Induction: Intravenous  PONV Risk Score and Plan: 2 and Ondansetron, Propofol infusion and TIVA  Airway Management Planned: Nasal Cannula  Additional Equipment: None  Intra-op Plan:   Post-operative Plan:   Informed Consent: I have reviewed the patients History and Physical, chart, labs and discussed the procedure including the risks, benefits and alternatives for the proposed anesthesia with the patient or authorized representative who has indicated his/her understanding and acceptance.     Dental advisory given  Plan Discussed with: CRNA and Surgeon  Anesthesia Plan Comments: (Discussed risks of anesthesia with patient, including possibility of difficulty with spontaneous  ventilation under anesthesia necessitating airway intervention, PONV, and rare risks such as cardiac or respiratory or neurological events. Patient understands.)        Anesthesia Quick Evaluation

## 2019-06-20 NOTE — Op Note (Signed)
[facility] Gastroenterology Patient Name: Joel Walters Procedure Date: 06/20/2019 7:29 AM MRN: [id number] Account #: [id number] Date of Birth: 1950-06-22 Admit Type: Outpatient Age: 69 Room: [facility] ENDO ROOM 3 Gender: Male Note Status: Finalized Procedure:             Colonoscopy Indications:           Screening for colorectal malignant neoplasm, This is                         the patient's first colonoscopy Providers:             Lin Landsman MD, MD Medicines:             Monitored Anesthesia Care Complications:         No immediate complications. Estimated blood loss: None. Procedure:             Pre-Anesthesia Assessment:                        - Prior to the procedure, a History and Physical was                         performed, and patient medications and allergies were                         reviewed. The patient is competent. The risks and                         benefits of the procedure and the sedation options and                         risks were discussed with the patient. All questions                         were answered and informed consent was obtained.                         Patient identification and proposed procedure were                         verified by the physician, the nurse, the                         anesthesiologist, the anesthetist and the technician                         in the pre-procedure area in the procedure room in the                         endoscopy suite. Mental Status Examination: alert and                         oriented. Airway Examination: normal oropharyngeal                         airway and neck mobility. Respiratory Examination:                         clear to auscultation. CV Examination: normal.  Prophylactic Antibiotics: The patient does not require                         prophylactic antibiotics. Prior Anticoagulants: The                         patient has taken no  previous anticoagulant or                         antiplatelet agents. ASA Grade Assessment: III - A                         patient with severe systemic disease. After reviewing                         the risks and benefits, the patient was deemed in                         satisfactory condition to undergo the procedure. The                         anesthesia plan was to use monitored anesthesia care                         (MAC). Immediately prior to administration of                         medications, the patient was re-assessed for adequacy                         to receive sedatives. The heart rate, respiratory                         rate, oxygen saturations, blood pressure, adequacy of                         pulmonary ventilation, and response to care were                         monitored throughout the procedure. The physical                         status of the patient was re-assessed after the                         procedure.                        After obtaining informed consent, the colonoscope was                         passed under direct vision. Throughout the procedure,                         the patient's blood pressure, pulse, and oxygen                         saturations were monitored continuously. The  Colonoscope was introduced through the anus and                         advanced to the the cecum, identified by appendiceal                         orifice and ileocecal valve. The colonoscopy was                         performed without difficulty. The patient tolerated                         the procedure well. The quality of the bowel                         preparation was evaluated using the BBPS [facility] Bowel                         Preparation Scale) with scores of: Right Colon = 3,                         Transverse Colon = 3 and Left Colon = 3 (entire mucosa                         seen well with no residual staining, small  fragments                         of stool or opaque liquid). The total BBPS score                         equals 9. Findings:      The perianal and digital rectal examinations were normal. Pertinent       negatives include normal sphincter tone and no palpable rectal lesions.      A diminutive polyp was found in the cecum. The polyp was sessile.      Four sessile polyps were found in the descending colon, ascending colon       and cecum. The polyps were 3 to 6 mm in size. These polyps were removed       with a cold snare. Resection and retrieval were complete.      The retroflexed view of the distal rectum and anal verge was normal and       showed no anal or rectal abnormalities. Impression:            - One diminutive polyp in the cecum.                        - Four 3 to 6 mm polyps in the descending colon, in                         the ascending colon and in the cecum, removed with a                         cold snare. Resected and retrieved.                        - The distal rectum and anal  verge are normal on                         retroflexion view. Recommendation:        - Discharge patient to home (with spouse).                        - Resume previous diet today.                        - Continue present medications.                        - Await pathology results.                        - Repeat colonoscopy in 5 years for surveillance of                         multiple polyps. Procedure Code(s):     --- Professional ---                        [phone], Colonoscopy, flexible; with removal of                         tumor(s), polyp(s), or other lesion(s) by snare                         technique Diagnosis Code(s):     --- Professional ---                        K63.5, Polyp of colon                        Z12.11, Encounter for screening for malignant neoplasm                         of colon CPT copyright 2019 American [employer] Association. All rights reserved. The codes  documented in this report are preliminary and upon coder review may  be revised to meet current compliance requirements. Dr. Ulyess Mort Lin Landsman MD, MD 06/20/2019 9:00:22 AM This report has been signed electronically. Number of Addenda: 0 Note Initiated On: 06/20/2019 7:29 AM Scope Withdrawal Time: 0 hours 10 minutes 37 seconds  Total Procedure Duration: 0 hours 12 minutes 53 seconds  Estimated Blood Loss:  Estimated blood loss: none.      [facility]

## 2019-06-20 NOTE — Patient Instructions (Signed)
1.  Avoid caffeine, tea, soda, and diet drinks, as these will all irritate the bladder 2.  Minimize fluid intake after 6 PM, and urinate twice before bed 3.  Weight loss and exercise can also help minimize the urination overnight  Overactive Bladder, Adult  Overactive bladder refers to a condition in which a person has a sudden need to pass urine. The person may leak urine if he or she cannot get to the bathroom fast enough (urinary incontinence). A person with this condition may also wake up several times in the night to go to the bathroom. Overactive bladder is associated with poor nerve signals between your bladder and your brain. Your bladder may get the signal to empty before it is full. You may also have very sensitive muscles that make your bladder squeeze too soon. These symptoms might interfere with daily work or social activities. What are the causes? This condition may be associated with or caused by:  Urinary tract infection.  Infection of nearby tissues, such as the prostate.  Prostate enlargement.  Surgery on the uterus or urethra.  Bladder stones, inflammation, or tumors.  Drinking too much caffeine or alcohol.  Certain medicines, especially medicines that get rid of extra fluid in the body (diuretics).  Muscle or nerve weakness, especially from: ? A spinal cord injury. ? Stroke. ? Multiple sclerosis. ? Parkinson's disease.  Diabetes.  Constipation. What increases the risk? You may be at greater risk for overactive bladder if you:  Are an older adult.  Smoke.  Are going through menopause.  Have prostate problems.  Have a neurological disease, such as stroke, dementia, Parkinson's disease, or multiple sclerosis (MS).  Eat or drink things that irritate the bladder. These include alcohol, spicy food, and caffeine.  Are overweight or obese. What are the signs or symptoms? Symptoms of this condition include:  Sudden, strong urge to urinate.  Leaking  urine.  Urinating 8 or more times a day.  Waking up to urinate 2 or more times a night. How is this diagnosed? Your health care provider may suspect overactive bladder based on your symptoms. He or she will diagnose this condition by:  A physical exam and medical history.  Blood or urine tests. You might need bladder or urine tests to help determine what is causing your overactive bladder. You might also need to see a health care provider who specializes in urinary tract problems (urologist). How is this treated? Treatment for overactive bladder depends on the cause of your condition and whether it is mild or severe. You can also make lifestyle changes at home. Options include:  Bladder training. This may include: ? Learning to control the urge to urinate by following a schedule that directs you to urinate at regular intervals (timed voiding). ? Doing Kegel exercises to strengthen your pelvic floor muscles, which support your bladder. Toning these muscles can help you control urination, even if your bladder muscles are overactive.  Special devices. This may include: ? Biofeedback, which uses sensors to help you become aware of your body's signals. ? Electrical stimulation, which uses electrodes placed inside the body (implanted) or outside the body. These electrodes send gentle pulses of electricity to strengthen the nerves or muscles that control the bladder. ? Women may use a plastic device that fits into the vagina and supports the bladder (pessary).  Medicines. ? Antibiotics to treat bladder infection. ? Antispasmodics to stop the bladder from releasing urine at the wrong time. ? Tricyclic antidepressants to relax bladder  muscles. ? Injections of botulinum toxin type A directly into the bladder tissue to relax bladder muscles.  Lifestyle changes. This may include: ? Weight loss. Talk to your health care provider about weight loss methods that would work best for you. ? Diet  changes. This may include reducing how much alcohol and caffeine you consume, or drinking fluids at different times of the day. ? Not smoking. Do not use any products that contain nicotine or tobacco, such as cigarettes and e-cigarettes. If you need help quitting, ask your health care provider.  Surgery. ? A device may be implanted to help manage the nerve signals that control urination. ? An electrode may be implanted to stimulate electrical signals in the bladder. ? A procedure may be done to change the shape of the bladder. This is done only in very severe cases. Follow these instructions at home: Lifestyle  Make any diet or lifestyle changes that are recommended by your health care provider. These may include: ? Drinking less fluid or drinking fluids at different times of the day. ? Cutting down on caffeine or alcohol. ? Doing Kegel exercises. ? Losing weight if needed. ? Eating a healthy and balanced diet to prevent constipation. This may include:  Eating foods that are high in fiber, such as fresh fruits and vegetables, whole grains, and beans.  Limiting foods that are high in fat and processed sugars, such as fried and sweet foods. General instructions  Take over-the-counter and prescription medicines only as told by your health care provider.  If you were prescribed an antibiotic medicine, take it as told by your health care provider. Do not stop taking the antibiotic even if you start to feel better.  Use any implants or pessary as told by your health care provider.  If needed, wear pads to absorb urine leakage.  Keep a journal or log to track how much and when you drink and when you feel the need to urinate. This will help your health care provider monitor your condition.  Keep all follow-up visits as told by your health care provider. This is important. Contact a health care provider if:  You have a fever.  Your symptoms do not get better with treatment.  Your pain  and discomfort get worse.  You have more frequent urges to urinate. Get help right away if:  You are not able to control your bladder. Summary  Overactive bladder refers to a condition in which a person has a sudden need to pass urine.  Several conditions may lead to an overactive bladder.  Treatment for overactive bladder depends on the cause and severity of your condition.  Follow your health care provider's instructions about lifestyle changes, doing Kegel exercises, keeping a journal, and taking medicines. This information is not intended to replace advice given to you by your health care provider. Make sure you discuss any questions you have with your health care provider. Document Revised: 05/24/2018 Document Reviewed: 02/16/2017 Elsevier Patient Education  [place].

## 2019-06-20 NOTE — H&P (Signed)
Joel Darby, MD [address]  [place]  [place], [place] [address]  Main: [phone]  Fax: [phone] Pager: [phone]  Primary Care Physician:  [facility] Primary Gastroenterologist:  Dr. Cephas Walters  Pre-Procedure History & Physical: HPI:  Joel Walters is a 69 y.o. male is here for an colonoscopy.   Past Medical History:  Diagnosis Date  . CAD, multiple vessel    MULTIVESSEL cad (mLAD (D1-past D2) 70-80% involving from Diags with ostial ~70%. pCx 70-80% (prior to Lat OM1) with normal AVG Cx& LPL w/ L-R Collaterals (small OM2 80-90%).  Large RI - normal. RCA moderate caliber ~99% (chronic subtotal CTO) --> s/p CABG x5   . GERD (gastroesophageal reflux disease)   . History of stomach ulcers   . Hypertension   . Prostate infection 2011  . 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 Truncal obesity   . Tumor of breast    REMOVED AT AGE 31 YEARS OLD  . Upper GI bleed 1980   PUD/ with blood transfusion    Past Surgical History:  Procedure Laterality Date  . CAPSULAR RELEASE  01/05/2012   Procedure: CAPSULAR RELEASE;  Surgeon: Cammie Sickle., MD;  Location: [place];  Service: Orthopedics;  Laterality: Right;  PIP CAPSULE RELEASE, FDS(FLEXOR DIGITORIUM SUPERFICIALIS) TENODESIS,  RIGHT SMALL RING FINGER,volar check rain ligament  . CARDIAC CATHETERIZATION    . CORONARY ARTERY BYPASS GRAFT  02/11/2011   Procedure: CORONARY ARTERY BYPASS GRAFTING (CABG);  Surgeon: Grace Isaac, MD;  Location: [place];  Service: Open Heart Surgery;  Laterality: N/A;;;LIMA TO LAD,SVG to D1, SVG to OM,SVG - RVM - PDA (SEQUENTIAL)  . DOPPLER ECHOCARDIOGRAPHY  02/09/2011  . INTRAOPERATIVE TEE  02/11/2011  . IRRIGATION AND DEBRIDEMENT SEBACEOUS CYST  age 83   left breast  . LEFT HEART CATHETERIZATION WITH CORONARY ANGIOGRAM N/A 02/02/2011   Procedure: LEFT HEART CATHETERIZATION WITH CORONARY ANGIOGRAM;  Surgeon:  Leonie Man, MD;  Location: [facility] CATH LAB;; EF 60-65%;SEVERE 3 VESSEL DISEASE , mLAD (D1-past D2) 70-80% involving both Diags w/ ostial ~70%. pCx 70-80% (prior to Lat OM1-p50%)) with normal AVG Cx& LPL w/ L-R Collaterals (small OM2 80-90%).  Large RI - normal. RCA moderate caliber ~99% (chronic subtotal CTO).  Marland Kitchen NASAL SINUS SURGERY  2010  . NM [facility] MULTIPLE W/SPECT  01/13/2011   showing large reversible basal inferoseptal,basal inferior,midinferior septal ,mid inferior,and apical inferior defect, likely due to the RCA lesion found on cathterization  . NM MYOCAR MULTIPLE W/SPECT  10/14/2011   a) EF 61%;  EXERCISE CAPACITY 10 METS, NO INDUCILE MYOCARDIAL ISCHEMIA; b) Reached 92% of peak heart rate 144 bpm.  Good exercise tolerance.  EF 55-65%.  LOW RISK.  No ischemia or infarction.  Marland Kitchen NM [facility] MULTIPLE W/SPECT  03/10/2017   Myoview March 10, 2017: Reached 92% of peak heart rate 144 bpm.  Good exercise tolerance.  EF 55-65%.  LOW RISK.  No ischemia or infarction.    Prior to Admission medications   Medication Sig Start Date End Date Taking? Authorizing Provider  aspirin EC 81 MG tablet Take 81 mg by mouth daily. 08/22/13  Yes Leonie Man, MD  Coenzyme Q10 (COQ10 PO) Take by mouth.   Yes [provider]  ezetimibe (ZETIA) 10 MG tablet Take 1 tablet (10 mg total) by mouth daily. 08/30/17 06/20/19 Yes Leonie Man, MD  losartan (COZAAR) 50 MG tablet Take  50 mg by mouth daily. 05/28/19  Yes [provider]  metoprolol succinate (TOPROL-XL) 50 MG 24 hr tablet Take 1 tablet (50 mg total) by mouth daily. 04/04/19  Yes Lendon Colonel, NP  Multiple Vitamins-Minerals (MULTIVITAMINS THER. W/MINERALS) TABS Take 1 tablet by mouth daily.    Yes [provider]  fluticasone (FLONASE) 50 MCG/ACT nasal spray Place 2 sprays into both nostrils daily. 03/18/19   [provider]  pravastatin (PRAVACHOL) 20 MG tablet TAKE 1 TABLET BY MOUTH EVERY DAY 01/11/19   Leonie Man, MD  Probiotic Product (PROBIOTIC DAILY PO) Take by mouth.    [provider]  sildenafil (REVATIO) 20 MG tablet Take 1 to 4 tablet as needed for erectile dysfunction Patient not taking: Reported on 05/30/2019 12/28/18   Leonie Man, MD  TURMERIC PO Take by mouth.    [provider]    Allergies as of 05/30/2019 - Review Complete 05/30/2019  Allergen Reaction Noted  . Codeine    . Crestor [rosuvastatin] Other (See Comments) 01/23/2013  . Lipitor [atorvastatin]  01/26/2013  . Lisinopril Cough 01/23/2013    Family History  Adopted: Yes  Problem Relation Age of Onset  . Diabetes Son   . Anesthesia problems Neg Hx   . Hypotension Neg Hx   . Malignant hyperthermia Neg Hx   . Pseudochol deficiency Neg Hx     Social History   Socioeconomic History  . Marital status: Married    Spouse name: Not on file  . Number of children: Not on file  . Years of education: Not on file  . Highest education level: Not on file  Occupational History  . Not on file  Tobacco Use  . Smoking status: Never Smoker  . Smokeless tobacco: Never Used  Substance and Sexual Activity  . Alcohol use: Yes    Comment: occ  . Drug use: No  . Sexual activity: Yes  Other Topics Concern  . Not on file  Social History Narrative   He is essentially now a "widower" as his long-term partner / "wife" was killed in a car accident this spring.  He has been having a hard time dealing with it.  When this happened, he was in the middle of changing jobs, & was simply unable to handle the time restrictions & rigors of the new job with his grief.  He is currently no longer employed.    He has 3  children from his first marriage and 9 grandchildren.  With his grieving, he has not been exercising like he used to & has been putting back on all of the weight that he had lost post-operatively.    He has never smoked & does not drink Alcohol.   Social Determinants of Health   Financial Resource  Strain:   . Difficulty of Paying Living Expenses:   Food Insecurity:   . Worried About Charity fundraiser in the Last Year:   . Arboriculturist in the Last Year:   Transportation Needs:   . Film/video editor (Medical):   Marland Kitchen Lack of Transportation (Non-Medical):   Physical Activity:   . Days of Exercise per Week:   . Minutes of Exercise per Session:   Stress:   . Feeling of Stress :   Social Connections:   . Frequency of Communication with Friends and Family:   . Frequency of Social Gatherings with Friends and Family:   . Attends Religious Services:   .  Active Member of Clubs or Organizations:   . Attends Archivist Meetings:   Marland Kitchen Marital Status:   Intimate Partner Violence:   . Fear of Current or Ex-Partner:   . Emotionally Abused:   Marland Kitchen Physically Abused:   . Sexually Abused:     Review of Systems: See HPI, otherwise negative ROS  Physical Exam: BP (!) 146/83   Pulse 70   Temp 98 F (36.7 C) (Temporal)   Resp 16   Ht 6\' 1"  (1.854 m)   Wt 115.7 kg   SpO2 97%   BMI 33.64 kg/m  General:   Alert,  pleasant and cooperative in NAD Head:  Normocephalic and atraumatic. Neck:  Supple; no masses or thyromegaly. Lungs:  Clear throughout to auscultation.    Heart:  Regular rate and rhythm. Abdomen:  Soft, nontender and nondistended. Normal bowel sounds, without guarding, and without rebound.   Neurologic:  Alert and  oriented x4;  grossly normal neurologically.  Impression/Plan: Joel Walters is here for an colonoscopy to be performed for colon cancer screening  Risks, benefits, limitations, and alternatives regarding  colonoscopy have been reviewed with the patient.  Questions have been answered.  All parties agreeable.   Sherri Sear, MD  06/20/2019, 8:27 AM

## 2019-06-20 NOTE — Progress Notes (Signed)
06/20/19 2:09 PM   Valora Corporal Jan 30, 1951 [id number]  CC: Urinary frequency, nocturia  HPI: I saw Mr. Gregary Cromer today for evaluation of urinary frequency and nocturia.  He is a 69 year old male with a history of CABG in 2011 who reports worsening urinary symptoms over the last few years.  He is minimally bothered by his urination during the day, however he has nocturia 2-5 times per night which is becoming bothersome.  He denies any significant weak stream or feeling of incomplete emptying.  He denies any history of UTI or retention.  He denies any history of gross hematuria.  He reports 1 distant episode of hematospermia with some brown-colored semen.  It sounds like he was trialed on Flomax a few years ago and had severe dizziness and multiple falls overnight and discontinued this medication.  He reports he does not snore, and he has never been evaluated for sleep apnea.  He does drink tea in the evenings before bed.  He is adopted and family history is unknown.  He is a never smoker and denies any other carcinogenic exposures.  He denies any leg swelling.  Urinalysis is completely benign today with 0-5 WBCs, 0 RBCs, no bacteria, nitrite negative.  IPSS score today is 11 with quality of life unhappy.  No prior PSA values.   PMH: Past Medical History:  Diagnosis Date  . CAD, multiple vessel    MULTIVESSEL cad (mLAD (D1-past D2) 70-80% involving from Diags with ostial ~70%. pCx 70-80% (prior to Lat OM1) with normal AVG Cx& LPL w/ L-R Collaterals (small OM2 80-90%).  Large RI - normal. RCA moderate caliber ~99% (chronic subtotal CTO) --> s/p CABG x5   . GERD (gastroesophageal reflux disease)   . History of stomach ulcers   . Hypertension   . Prostate infection 2011  . 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 Truncal obesity   . Tumor of breast    REMOVED AT AGE 69 YEARS OLD  . Upper GI bleed 1980   PUD/ with blood transfusion    Surgical History: Past Surgical  History:  Procedure Laterality Date  . CAPSULAR RELEASE  01/05/2012   Procedure: CAPSULAR RELEASE;  Surgeon: Cammie Sickle., MD;  Location: [place];  Service: Orthopedics;  Laterality: Right;  PIP CAPSULE RELEASE, FDS(FLEXOR DIGITORIUM SUPERFICIALIS) TENODESIS,  RIGHT SMALL RING FINGER,volar check rain ligament  . CARDIAC CATHETERIZATION    . CORONARY ARTERY BYPASS GRAFT  02/11/2011   Procedure: CORONARY ARTERY BYPASS GRAFTING (CABG);  Surgeon: Grace Isaac, MD;  Location: [facility];  Service: Open Heart Surgery;  Laterality: N/A;;;LIMA TO LAD,SVG to D1, SVG to OM,SVG - RVM - PDA (SEQUENTIAL)  . DOPPLER ECHOCARDIOGRAPHY  02/09/2011  . INTRAOPERATIVE TEE  02/11/2011  . IRRIGATION AND DEBRIDEMENT SEBACEOUS CYST  age 89   left breast  . LEFT HEART CATHETERIZATION WITH CORONARY ANGIOGRAM N/A 02/02/2011   Procedure: LEFT HEART CATHETERIZATION WITH CORONARY ANGIOGRAM;  Surgeon: Leonie Man, MD;  Location: [facility] CATH LAB;; EF 60-65%;SEVERE 3 VESSEL DISEASE , mLAD (D1-past D2) 70-80% involving both Diags w/ ostial ~70%. pCx 70-80% (prior to Lat OM1-p50%)) with normal AVG Cx& LPL w/ L-R Collaterals (small OM2 80-90%).  Large RI - normal. RCA moderate caliber ~99% (chronic subtotal CTO).  Marland Kitchen NASAL SINUS SURGERY  2010  . NM [facility] MULTIPLE W/SPECT  01/13/2011   showing large reversible basal inferoseptal,basal inferior,midinferior septal ,mid inferior,and apical inferior defect, likely due to  the RCA lesion found on cathterization  . NM MYOCAR MULTIPLE W/SPECT  10/14/2011   a) EF 61%;  EXERCISE CAPACITY 10 METS, NO INDUCILE MYOCARDIAL ISCHEMIA; b) Reached 92% of peak heart rate 144 bpm.  Good exercise tolerance.  EF 55-65%.  LOW RISK.  No ischemia or infarction.  Marland Kitchen [facility] MULTIPLE W/SPECT  03/10/2017   Myoview March 10, 2017: Reached 92% of peak heart rate 144 bpm.  Good exercise tolerance.  EF 55-65%.  LOW RISK.  No ischemia or infarction.    Family History: Family History    Adopted: Yes  Problem Relation Age of Onset  . Diabetes Son   . Anesthesia problems Neg Hx   . Hypotension Neg Hx   . Malignant hyperthermia Neg Hx   . Pseudochol deficiency Neg Hx     Social History:  reports that he has never smoked. He has never used smokeless tobacco. He reports current alcohol use. He reports that he does not use drugs.  Physical Exam: BP (!) 162/80 (BP Location: Left Arm, Patient Position: Sitting, Cuff Size: Large)   Pulse 80   Ht 6\' 1"  (1.854 m)   Wt 258 lb (117 kg)   BMI 34.04 kg/m    Constitutional:  Alert and oriented, No acute distress. Cardiovascular: No clubbing, cyanosis, or edema. Respiratory: Normal respiratory effort, no increased work of breathing. GI: Abdomen is soft, nontender, nondistended, no abdominal masses GU: Widely patent meatus, testicles descended and 20 cc bilaterally without masses DRE: 30 g, smooth, no nodules  Laboratory Data: Reviewed  Assessment & Plan:   In summary, he is a 69 year old male with worsening nocturia, and minimally bothersome symptoms during the day.  It sounds like he has tried Flomax in the past and had significant bothersome side effects from this with dizziness and falls.  We a long conversation about behavioral strategies to improve his urinary frequency and nocturia including avoiding bladder irritants like caffeine, soda, tea, alcohol, double voiding prior to bed, considering evaluation for sleep apnea, and exercise weight loss.  I would be hesitant to start medication at this time with his significant side effects in the past, and his minimally bothersome symptoms.  RTC 3 months for symptom check, IPSS, PVR Consider anticholinergic at that time if persistently bothersome symptoms  I spent 45 total minutes on the day of the encounter including pre-visit review of the medical record, face-to-face time with the patient, and post visit ordering of labs/imaging/tests.  Nickolas Madrid,  MD 06/20/2019  [facility] [address] [place], [place] [address] [phone]

## 2019-06-20 NOTE — Transfer of Care (Signed)
Immediate Anesthesia Transfer of Care Note  Patient: Joel Walters  Procedure(s) Performed: COLONOSCOPY WITH PROPOFOL (N/A )  Patient Location: Endoscopy Unit  Anesthesia Type:General  Level of Consciousness: awake, alert  and oriented  Airway & Oxygen Therapy: Patient Spontanous Breathing and Patient connected to nasal cannula oxygen  Post-op Assessment: Post -op Vital signs reviewed and stable  Post vital signs: stable  Last Vitals:  Vitals Value Taken Time  BP 88/56 06/20/19 0906  Temp    Pulse 71 06/20/19 0906  Resp 16 06/20/19 0906  SpO2 100 % 06/20/19 0906  Vitals shown include unvalidated device data.  Last Pain:  Vitals:   06/20/19 0819  TempSrc: Temporal         Complications: No apparent anesthesia complications

## 2019-06-21 ENCOUNTER — Encounter: Payer: Self-pay | Admitting: Gastroenterology

## 2019-06-21 ENCOUNTER — Encounter: Payer: Self-pay | Admitting: *Deleted

## 2019-06-21 LAB — SURGICAL PATHOLOGY

## 2019-09-19 ENCOUNTER — Ambulatory Visit: Payer: Self-pay | Admitting: Urology

## 2019-09-20 ENCOUNTER — Encounter: Payer: Self-pay | Admitting: Urology

## 2019-10-12 ENCOUNTER — Other Ambulatory Visit: Payer: Self-pay | Admitting: Cardiology

## 2022-08-23 ENCOUNTER — Ambulatory Visit (INDEPENDENT_AMBULATORY_CARE_PROVIDER_SITE_OTHER): Payer: Medicare HMO | Admitting: Dermatology

## 2022-08-23 VITALS — BP 162/68 | HR 70

## 2022-08-23 DIAGNOSIS — L578 Other skin changes due to chronic exposure to nonionizing radiation: Secondary | ICD-10-CM | POA: Diagnosis not present

## 2022-08-23 DIAGNOSIS — L570 Actinic keratosis: Secondary | ICD-10-CM

## 2022-08-23 DIAGNOSIS — L821 Other seborrheic keratosis: Secondary | ICD-10-CM | POA: Diagnosis not present

## 2022-08-23 DIAGNOSIS — L814 Other melanin hyperpigmentation: Secondary | ICD-10-CM

## 2022-08-23 DIAGNOSIS — W908XXA Exposure to other nonionizing radiation, initial encounter: Secondary | ICD-10-CM

## 2022-08-23 NOTE — Progress Notes (Signed)
   New Patient Visit   Subjective  Joel Walters is a 72 y.o. male who presents for the following: Irregular skin lesions on the R face, patient concerned and would like checked today. The patient has spots, moles and lesions to be evaluated, some may be new or changing and the patient may have concern these could be cancer.  The following portions of the chart were reviewed this encounter and updated as appropriate: medications, allergies, medical history  Review of Systems:  No other skin or systemic complaints except as noted in HPI or Assessment and Plan.  Objective  Well appearing patient in no apparent distress; mood and affect are within normal limits.  A focused examination was performed of the following areas: The face, scalp, ears, arms, and hands  Relevant exam findings are noted in the Assessment and Plan.  R face x 6, L face x 5, nose x 1 (12) Erythematous thin papules/macules with gritty scale.    Assessment & Plan   ACTINIC DAMAGE - chronic, secondary to cumulative UV radiation exposure/sun exposure over time - diffuse scaly erythematous macules with underlying dyspigmentation - Recommend daily broad spectrum sunscreen SPF 30+ to sun-exposed areas, reapply every 2 hours as needed.  - Recommend staying in the shade or wearing long sleeves, sun glasses (UVA+UVB protection) and wide brim hats (4-inch brim around the entire circumference of the hat). - Call for new or changing lesions.  SEBORRHEIC KERATOSIS - Stuck-on, waxy, tan-brown papules and/or plaques  - Benign-appearing - Discussed benign etiology and prognosis. - Observe - Call for any changes  LENTIGINES Exam: scattered tan macules Due to sun exposure Treatment Plan: Benign-appearing, observe. Recommend daily broad spectrum sunscreen SPF 30+ to sun-exposed areas, reapply every 2 hours as needed.  Call for any changes  AK (actinic keratosis) (12) R face x 6, L face x 5, nose x 1  Destruction of lesion  - R face x 6, L face x 5, nose x 1 (12) Complexity: simple   Destruction method: cryotherapy   Informed consent: discussed and consent obtained   Timeout:  patient name, date of birth, surgical site, and procedure verified Lesion destroyed using liquid nitrogen: Yes   Region frozen until ice ball extended beyond lesion: Yes   Outcome: patient tolerated procedure well with no complications   Post-procedure details: wound care instructions given     Return in about 6 months (around 02/23/2023) for ISK and AK follow up.  Maylene Roes, CMA, am acting as scribe for Armida Sans, MD .  Documentation: I have reviewed the above documentation for accuracy and completeness, and I agree with the above.  Armida Sans, MD

## 2022-08-23 NOTE — Patient Instructions (Addendum)
Actinic Keratosis An actinic keratosis is a precancerous growth on the skin. If there is more than one, the condition is called actinic keratoses. These growths appear most often on parts of the skin that get a lot of sun exposure, including the: Scalp. Face. Ears. Lips. Upper back. Forearms. Backs of the hands. If left untreated, these growths may develop into a skin cancer called squamous cell carcinoma. It is important to have all these growths checked by a health care provider to determine the best treatment. What are the causes? Actinic keratoses are caused by getting too much ultraviolet (UV) radiation from the sun or other UV light sources. What increases the risk? You are more likely to develop this condition if you: Have light-colored skin or blue eyes. Have blond or red hair. Spend a lot of time in the sun. Do not protect your skin from the sun when outdoors. Are an older person. The risk of developing an actinic keratosis increases with age. What are the signs or symptoms? These growths feel like scaly, rough spots of skin. Symptoms of this condition include growths that may: Be as small as a pinhead or as big as a quarter. Itch, hurt, or feel sensitive. Be skin-colored, light tan, dark tan, pink, or a combination of these colors. In most cases, the growths become red. Have a small piece of pink or gray skin (skin tag) growing from them. It may be easier to notice the growths by feeling them rather than seeing them. Sometimes, actinic keratoses disappear but may return a few days to a few weeks later. How is this diagnosed? This condition is usually diagnosed with a physical exam. A tissue sample may be removed from the growth and examined under a microscope (biopsy). How is this treated? This condition may be treated by: Scraping off the actinic keratosis (curettage). Freezing the actinic keratosis with liquid nitrogen (cryosurgery). This causes the growth to eventually  fall off. Applying medicated creams or gels to destroy the cells in the growth. Applying chemicals to the growth to make the outer layers of skin peel off (chemical peel). Using photodynamic therapy. In this procedure, medicated cream is applied to the actinic keratosis. This cream increases your skin's sensitivity to light. Then, a strong light is aimed at the actinic keratosis to destroy cells in the growth. Follow these instructions at home: Skin care Apply cool, wet cloths (coolcompresses) to the affected areas. Do not scratch your skin. Check your skin regularly for any growths, especially ones that: Start to itch or bleed. Change in size, shape, or color. Caring for the treated area Keep the treated area clean and dry as told by your health care provider. Do not apply any medicine, cream, or lotion to the treated area unless your health care provider tells you to do that. Do not pick at blisters or try to break them open. This can cause infection and scarring. If you have red or irritated skin after treatment, follow instructions from your health care provider about how to take care of the treated area. Make sure you: Wash your hands with soap and water for at least 20 seconds before and after you change your bandage (dressing). If soap and water are not available, use hand sanitizer. Change your dressing as told by your health care provider. If you have red or irritated skin after treatment, check the treated area every day for signs of infection. Check for: Redness, swelling, or pain. Fluid or blood. Warmth. Pus or a bad  smell. Lifestyle Do not use any products that contain nicotine or tobacco. These products include cigarettes, chewing tobacco, and vaping devices, such as e-cigarettes. If you need help quitting, ask your health care provider. Take steps to protect your skin from the sun, such as: Avoiding the sun between 10:00 a.m. and 4:00 p.m. This is when the UV light is the  strongest. Using a sunscreen or sunblock with SPF 30 or greater. Applying sunscreen before you are exposed to sunlight and reapplying as often as told by the instructions on the sunscreen container. Wearing protective gear, including: Sunglasses with UV protection. A hat and clothing that protect your skin from sunlight. Avoiding medicines that increase your sensitivity to sunlight when possible. Avoidingtanning beds and other indoor tanning devices. General instructions Take or apply over-the-counter and prescription medicines only as told by your health care provider. Return to your normal activities as told by your health care provider. Ask your health care provider what activities are safe for you. Have a skin exam done every year by a health care provider who is a skin specialist (dermatologist). Keep all follow-up visits. Your health care provider will want to check that the site has healed after treatment. Contact a health care provider if: You notice any changes or new growths on your skin. You have swelling, pain, or redness around your treated area. You have fluid or blood coming from your treated area. Your treated area feels warm to the touch. You have pus or a bad smell coming from your treated area. You have a fever or chills. You have a blister that becomes large and painful. Summary An actinic keratosis is a precancerous growth on the skin.If left untreated, these growths can develop into skin cancer. Check your skin regularly for any growths, especially growths that start to itch or bleed, or change in size, shape, or color. Take steps to protect your skin from the sun. Contact a health care provider if you notice any changes or new growths on your skin. This information is not intended to replace advice given to you by your health care provider. Make sure you discuss any questions you have with your health care provider. Document Revised: 04/15/2021 Document Reviewed:  04/15/2021 Elsevier Patient Education  2024 [employer].    Due to recent changes in healthcare laws, you may see results of your pathology and/or laboratory studies on MyChart before the doctors have had a chance to review them. We understand that in some cases there may be results that are confusing or concerning to you. Please understand that not all results are received at the same time and often the doctors may need to interpret multiple results in order to provide you with the best plan of care or course of treatment. Therefore, we ask that you please give [place] 2 business days to thoroughly review all your results before contacting the office for clarification. Should we see a critical lab result, you will be contacted sooner.   If You Need Anything After Your Visit  If you have any questions or concerns for your doctor, please call our main line at [phone] and press option 4 to reach your doctor's medical assistant. If no one answers, please leave a voicemail as directed and we will return your call as soon as possible. Messages left after 4 pm will be answered the following business day.   You may also send [place] a message via MyChart. We typically respond to MyChart messages within 1-2 business days.  For prescription refills, please ask your pharmacy to contact our office. Our fax number is [phone].  If you have an urgent issue when the clinic is closed that cannot wait until the next business day, you can page your doctor at the number below.    Please note that while we do our best to be available for urgent issues outside of office hours, we are not available 24/7.   If you have an urgent issue and are unable to reach [place], you may choose to seek medical care at your doctor's office, retail clinic, urgent care center, or emergency room.  If you have a medical emergency, please immediately call 911 or go to the emergency department.  Pager Numbers  - Dr. Gwen Pounds:  [phone]  - Dr. Neale Burly: [phone]  - Dr. Roseanne Reno: [phone]  In the event of inclement weather, please call our main line at [phone] for an update on the status of any delays or closures.  Dermatology Medication Tips: Please keep the boxes that topical medications come in in order to help keep track of the instructions about where and how to use these. Pharmacies typically print the medication instructions only on the boxes and not directly on the medication tubes.   If your medication is too expensive, please contact our office at [phone] option 4 or send [place] a message through MyChart.   We are unable to tell what your co-pay for medications will be in advance as this is different depending on your insurance coverage. However, we may be able to find a substitute medication at lower cost or fill out paperwork to get insurance to cover a needed medication.   If a prior authorization is required to get your medication covered by your insurance company, please allow [place] 1-2 business days to complete this process.  Drug prices often vary depending on where the prescription is filled and some pharmacies may offer cheaper prices.  The website www.goodrx.com contains coupons for medications through different pharmacies. The prices here do not account for what the cost may be with help from insurance (it may be cheaper with your insurance), but the website can give you the price if you did not use any insurance.  - You can print the associated coupon and take it with your prescription to the pharmacy.  - You may also stop by our office during regular business hours and pick up a GoodRx coupon card.  - If you need your prescription sent electronically to a different pharmacy, notify our office through [facility] or by phone at [phone] option 4.     Si Usted Necesita Algo Despus de Su Visita  Tambin puede enviarnos un mensaje a travs de Clinical cytogeneticist. Por lo general  respondemos a los mensajes de MyChart en el transcurso de 1 a 2 das hbiles.  Para renovar recetas, por favor pida a su farmacia que se ponga en contacto con nuestra oficina. Annie Sable de fax es [place] [phone].  Si tiene un asunto urgente cuando la clnica est cerrada y que no puede esperar hasta el siguiente da hbil, puede llamar/localizar a su doctor(a) al nmero que aparece a continuacin.   Por favor, tenga en cuenta que aunque hacemos todo lo posible para estar disponibles para asuntos urgentes fuera del horario de [place], no estamos disponibles las 24 horas del da, los 7 [address]  [address] de la [place].   Si tiene un problema urgente y no puede comunicarse con nosotros, puede optar por buscar atencin mdica  en  el consultorio de su doctor(a), en una clnica privada, en un centro de atencin urgente o en una sala de emergencias.  Si tiene Engineer, drilling, por favor llame inmediatamente al 911 o vaya a la sala de emergencias.  Nmeros de bper  - Dr. Gwen Pounds: [phone]  - Dra. Moye: [phone]  - Dra. Roseanne Reno: [phone]  En caso de inclemencias del [place], por favor llame a Lacy Duverney principal al [phone] para una actualizacin sobre el [place] de cualquier retraso o cierre.  Consejos para la medicacin en dermatologa: Por favor, guarde las cajas en las que vienen los medicamentos de uso tpico para ayudarle a seguir las instrucciones sobre dnde y cmo usarlos. Las farmacias generalmente imprimen las instrucciones del medicamento slo en las cajas y no directamente en los tubos del [place].   Si su medicamento es muy caro, por favor, pngase en contacto con Rolm Gala llamando al [phone] y presione la opcin 4 o envenos un mensaje a travs de Clinical cytogeneticist.   No podemos decirle cul ser su copago por los medicamentos por adelantado ya que esto es diferente dependiendo de la cobertura de su seguro. Sin embargo, es posible que podamos encontrar un  medicamento sustituto a Audiological scientist un formulario para que el seguro cubra el medicamento que se considera necesario.   Si se requiere una autorizacin previa para que su compaa de seguros [place] su medicamento, por favor permtanos de 1 a 2 das hbiles para completar [address]  Los precios de los medicamentos varan con frecuencia dependiendo del Environmental consultant de dnde se surte la receta y alguna farmacias pueden ofrecer precios ms baratos.  El sitio web www.goodrx.com tiene cupones para medicamentos de Health and safety inspector. Los precios aqu no tienen en cuenta lo que podra costar con la ayuda del seguro (puede ser ms barato con su seguro), pero el sitio web puede darle el precio si no utiliz Tourist information centre manager.  - Puede imprimir el cupn correspondiente y llevarlo con su receta a la farmacia.  - Tambin puede pasar por nuestra oficina durante el horario de atencin regular y Education officer, museum una tarjeta de cupones de GoodRx.  - Si necesita que su receta se enve electrnicamente a una farmacia diferente, informe a nuestra oficina a travs de MyChart de [place] o por telfono llamando al [phone] y presione la opcin 4.

## 2022-08-26 ENCOUNTER — Encounter: Payer: Self-pay | Admitting: Dermatology

## 2023-02-23 ENCOUNTER — Ambulatory Visit: Payer: Medicare HMO | Admitting: Dermatology

## 2023-03-08 ENCOUNTER — Ambulatory Visit (INDEPENDENT_AMBULATORY_CARE_PROVIDER_SITE_OTHER): Payer: Medicare HMO | Admitting: Dermatology

## 2023-03-08 DIAGNOSIS — W908XXA Exposure to other nonionizing radiation, initial encounter: Secondary | ICD-10-CM

## 2023-03-08 DIAGNOSIS — L570 Actinic keratosis: Secondary | ICD-10-CM | POA: Diagnosis not present

## 2023-03-08 DIAGNOSIS — L578 Other skin changes due to chronic exposure to nonionizing radiation: Secondary | ICD-10-CM

## 2023-03-08 DIAGNOSIS — L814 Other melanin hyperpigmentation: Secondary | ICD-10-CM

## 2023-03-08 DIAGNOSIS — L918 Other hypertrophic disorders of the skin: Secondary | ICD-10-CM

## 2023-03-08 DIAGNOSIS — L821 Other seborrheic keratosis: Secondary | ICD-10-CM

## 2023-03-08 NOTE — Patient Instructions (Signed)

## 2023-03-08 NOTE — Progress Notes (Signed)
Follow-Up Visit   Subjective  Joel Walters is a 73 y.o. male who presents for the following: 6 month follow-up Actinic keratosis. The patient has spots, moles and lesions to be evaluated, some may be new or changing. He has a couple of spots to check on the right forehead and right neck, gets irritated.    The following portions of the chart were reviewed this encounter and updated as appropriate: medications, allergies, medical history  Review of Systems:  No other skin or systemic complaints except as noted in HPI or Assessment and Plan.  Objective  Well appearing patient in no apparent distress; mood and affect are within normal limits.  A focused examination was performed of the following areas: Face  Relevant exam findings are noted in the Assessment and Plan.  Right forehead x 1, Right temple hairline x 1 Stuck-on, waxy, tan-brown papule or plaque --Discussed benign etiology and prognosis.   Left Frontal Scalp x 1 Pink scaly macule.  Assessment & Plan   SEBORRHEIC KERATOSIS Right forehead x 1, Right temple hairline x 1 Reassured benign age-related growth. Patient would like treated. Discussed cosmetic procedure cryotherapy, noncovered.  $60 for 1st lesion and $15 for each additional lesion if done on the same day.  Maximum charge $350.  One touch-up treatment included no charge. Discussed risks of treatment including dyspigmentation, small scar, and/or recurrence. Recommend daily broad spectrum sunscreen SPF 30+/photoprotection to treated areas once healed.  Destruction of lesion - Right forehead x 1, Right temple hairline x 1  Destruction method: cryotherapy   Informed consent: discussed and consent obtained   Lesion destroyed using liquid nitrogen: Yes   Region frozen until ice ball extended beyond lesion: Yes   Outcome: patient tolerated procedure well with no complications   Post-procedure details: wound care instructions given   Additional details:  Prior to  procedure, discussed risks of blister formation, small wound, skin dyspigmentation, or rare scar following cryotherapy. Recommend Vaseline ointment to treated areas while healing.  AK (ACTINIC KERATOSIS) Left Frontal Scalp x 1 Actinic keratoses are precancerous spots that appear secondary to cumulative UV radiation exposure/sun exposure over time. They are chronic with expected duration over 1 year. A portion of actinic keratoses will progress to squamous cell carcinoma of the skin. It is not possible to reliably predict which spots will progress to skin cancer and so treatment is recommended to prevent development of skin cancer.  Recommend daily broad spectrum sunscreen SPF 30+ to sun-exposed areas, reapply every 2 hours as needed.  Recommend staying in the shade or wearing long sleeves, sun glasses (UVA+UVB protection) and wide brim hats (4-inch brim around the entire circumference of the hat). Call for new or changing lesions. Destruction of lesion - Left Frontal Scalp x 1  Destruction method: cryotherapy   Informed consent: discussed and consent obtained   Lesion destroyed using liquid nitrogen: Yes   Region frozen until ice ball extended beyond lesion: Yes   Outcome: patient tolerated procedure well with no complications   Post-procedure details: wound care instructions given   Additional details:  Prior to procedure, discussed risks of blister formation, small wound, skin dyspigmentation, or rare scar following cryotherapy. Recommend Vaseline ointment to treated areas while healing.   ACTINIC DAMAGE - chronic, secondary to cumulative UV radiation exposure/sun exposure over time - diffuse scaly erythematous macules with underlying dyspigmentation - Recommend daily broad spectrum sunscreen SPF 30+ to sun-exposed areas, reapply every 2 hours as needed.  - Recommend staying in the  shade or wearing long sleeves, sun glasses (UVA+UVB protection) and wide brim hats (4-inch brim around the  entire circumference of the hat). - Call for new or changing lesions.   SEBORRHEIC KERATOSIS - Stuck-on, waxy, tan-brown papules and/or plaques  - Benign-appearing - Discussed benign etiology and prognosis. - Observe - Call for any changes  LENTIGINES Exam: scattered tan macules Due to sun exposure Treatment Plan: Benign-appearing, observe. Recommend daily broad spectrum sunscreen SPF 30+ to sun-exposed areas, reapply every 2 hours as needed.  Call for any changes  Acrochordons (Skin Tags) - Symptomatic, Removal desired by patient - Fleshy, skin-colored pedunculated papules - Benign appearing.  - Patient desires removal. Reviewed that this is not covered by insurance and they will be charged a cosmetic fee for removal. Patient signed non-covered consent.  - Prior to the procedure, reviewed the expected small wound. Also reviewed the risk of leaving a small scar and the small risk of infection.  PROCEDURE - The areas were prepped with isopropyl alcohol. A small amount of lidocaine 1% with epinephrine was injected at the base of each lesion to achieve good local anesthesia. The skin tags were removed using a snip technique. Aluminum chloride was used for hemostasis. Petrolatum and a bandage were applied. The procedure was tolerated well. - Wound care was reviewed with the patient. They were advised to call with any concerns. Total number of treated acrochordons right neck x 3.   Return in about 6 months (around 09/05/2023) for with Dr Kirtland Bouchard for AKs.  ICherlyn Labella, CMA, am acting as scribe for Willeen Niece, MD .   Documentation: I have reviewed the above documentation for accuracy and completeness, and I agree with the above.  Willeen Niece, MD

## 2023-07-31 ENCOUNTER — Ambulatory Visit: Payer: Medicare HMO | Admitting: Dermatology

## 2023-10-19 ENCOUNTER — Ambulatory Visit: Payer: Medicare HMO | Admitting: Dermatology

## 2023-11-06 ENCOUNTER — Ambulatory Visit: Admitting: Dermatology

## 2024-02-05 ENCOUNTER — Ambulatory Visit: Admitting: Dermatology

## 2024-03-15 ENCOUNTER — Ambulatory Visit: Attending: Cardiology | Admitting: Cardiology

## 2024-03-15 ENCOUNTER — Telehealth: Payer: Self-pay

## 2024-03-15 ENCOUNTER — Encounter: Payer: Self-pay | Admitting: Cardiology

## 2024-03-15 ENCOUNTER — Other Ambulatory Visit (HOSPITAL_COMMUNITY): Payer: Self-pay

## 2024-03-15 VITALS — BP 158/74 | HR 62 | Ht 73.0 in | Wt 259.0 lb

## 2024-03-15 DIAGNOSIS — I25110 Atherosclerotic heart disease of native coronary artery with unstable angina pectoris: Secondary | ICD-10-CM

## 2024-03-15 DIAGNOSIS — I200 Unstable angina: Secondary | ICD-10-CM

## 2024-03-15 DIAGNOSIS — E1165 Type 2 diabetes mellitus with hyperglycemia: Secondary | ICD-10-CM | POA: Diagnosis not present

## 2024-03-15 DIAGNOSIS — E7800 Pure hypercholesterolemia, unspecified: Secondary | ICD-10-CM

## 2024-03-15 DIAGNOSIS — I10 Essential (primary) hypertension: Secondary | ICD-10-CM

## 2024-03-15 LAB — CBC
Hematocrit: 52.3 % — ABNORMAL HIGH (ref 37.5–51.0)
Hemoglobin: 16.8 g/dL (ref 13.0–17.7)
MCH: 27.8 pg (ref 26.6–33.0)
MCHC: 32.1 g/dL (ref 31.5–35.7)
MCV: 87 fL (ref 79–97)
Platelets: 452 10*3/uL — ABNORMAL HIGH (ref 150–450)
RBC: 6.04 x10E6/uL — ABNORMAL HIGH (ref 4.14–5.80)
RDW: 13.4 % (ref 11.6–15.4)
WBC: 12.1 10*3/uL — ABNORMAL HIGH (ref 3.4–10.8)

## 2024-03-15 LAB — COMPREHENSIVE METABOLIC PANEL WITH GFR
ALT: 22 [IU]/L (ref 0–44)
AST: 22 [IU]/L (ref 0–40)
Albumin: 4.5 g/dL (ref 3.8–4.8)
Alkaline Phosphatase: 96 [IU]/L (ref 47–123)
BUN/Creatinine Ratio: 16 (ref 10–24)
BUN: 18 mg/dL (ref 8–27)
Bilirubin Total: 0.9 mg/dL (ref 0.0–1.2)
CO2: 23 mmol/L (ref 20–29)
Calcium: 10.2 mg/dL (ref 8.6–10.2)
Chloride: 100 mmol/L (ref 96–106)
Creatinine, Ser: 1.12 mg/dL (ref 0.76–1.27)
Globulin, Total: 2.7 g/dL (ref 1.5–4.5)
Glucose: 91 mg/dL (ref 70–99)
Potassium: 5.8 mmol/L (ref 3.5–5.2)
Sodium: 140 mmol/L (ref 134–144)
Total Protein: 7.2 g/dL (ref 6.0–8.5)
eGFR: 70 mL/min/{1.73_m2}

## 2024-03-15 MED ORDER — HYDROCHLOROTHIAZIDE 25 MG PO TABS
25.0000 mg | ORAL_TABLET | ORAL | 3 refills | Status: AC
  Filled 2024-03-15: qty 90, 90d supply, fill #0

## 2024-03-15 MED ORDER — AMLODIPINE BESYLATE 5 MG PO TABS
5.0000 mg | ORAL_TABLET | Freq: Every day | ORAL | 2 refills | Status: AC
  Filled 2024-03-15: qty 30, 30d supply, fill #0

## 2024-03-15 MED ORDER — ASPIRIN 81 MG PO CHEW: 81.0000 mg | CHEWABLE_TABLET | Freq: Every day | ORAL | Status: AC

## 2024-03-15 NOTE — Progress Notes (Unsigned)
 " Cardiology Office Note:  .   Date:  03/16/2024  ID:  Joel Walters, DOB [birth year], MRN [id number] PCP: [facility]  [facility] Providers Cardiologist:  Gordy Bergamo, MD   History of Present Illness: .   Joel Walters is a 74 y.o. male patient with hypertension, hypercholesterolemia, multivessel CAD SP CABG x 5 in 2012 with LIMA to LAD, SVG to D1, SVG to OM sequential SVG to [facility] and RPDA, last seen in our office in 2021, presents for reestablishing care in view of chest pain and episodes of dyspnea and PND.    Discussed the use of AI scribe software for clinical note transcription with the patient, who gave verbal consent to proceed.  History of Present Illness Joel Walters is a 74 year old male with coronary artery disease and diabetes who presents with episodes of chest tightness and heaviness. He is accompanied by his wife, [name].  He describes three episodes of chest tightness and heaviness over the past two weeks, most recently two days ago, mainly when lying down. He feels a lot of pressure and weight around his chest and throat that improves when he sits up. He denies dizziness or sharp chest pain with these episodes.  He has coronary artery disease with bypass surgery in 2012. He has no exertional chest pain or dyspnea with activities such as climbing stairs at work, but notes occasional wheezing when lying down.  He has diabetes treated with metformin  500 mg twice daily since October. He has lost about five to eight pounds since last summer while reducing food intake. He takes losartan for blood pressure around lunchtime. He previously had cough and breathing difficulty on lisinopril .  He does not smoke or use tobacco. He drinks alcohol occasionally. 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  Cardiac Studies relevent.     Coronary angiogram 2012: 100%mRCA, 80% early mid LAD; Bifurcation Cx-OM1 80% & OM2 prox 90%; LPL-RPL collaterals fill RPL & RPDA  CABG x 5 in 2012 with LIMA to LAD, SVG to D1, SVG to OM sequential SVG to [facility] and RPDA  Myocardial perfusion scan 03/10/2017: Exercised on Bruce protocol for 8 minutes and 40 seconds, normal perfusion with normal LVEF.  EKG:   EKG Interpretation Date/Time:  Friday March 15 2024 10:34:14 EST Ventricular Rate:  60 PR Interval:  158 QRS Duration:  140 QT Interval:  436 QTC Calculation: 436 R Axis:   -19  Text Interpretation: EKG 03/15/2024: Normal sinus rhythm at rate of 60 bpm, inferior infarct old.  Right bundle branch block.  No evidence of ischemia.  Compared to 02/12/2011, right bundle branch block is new. Confirmed by Bergamo Gordy [phone]) on 03/15/2024 10:39:03 AM  Labs   Care everywhere/Faxed External Labs:  Labs 11/04/2023:  Serum glucose 1 1119 mg, BUN 21, creatinine 1.07, eGFR 74 mL, potassium 4.4, LFTs normal.  Total cholesterol 176, triglycerides 102, HDL 29, LDL 128.  A1c 7.3%.  ROS  Review of Systems  Cardiovascular:  Positive for chest pain, dyspnea on exertion and paroxysmal nocturnal dyspnea. Negative for leg swelling.   Physical Exam:   VS:  BP (!) 158/74 (BP Location: Right Arm, Patient Position: Sitting, Cuff Size: Large)   Pulse 62   Ht 6' 1 (1.854 m)   Wt 259 lb (117.5 kg)   SpO2 93%   BMI  34.17 kg/m    Wt Readings from Last 3 Encounters:  03/15/24 259 lb (117.5 kg)  06/20/19 258 lb (117 kg)  06/20/19 255 lb (115.7 kg)    BP Readings from Last 3 Encounters:  03/16/24 (!) 159/76  03/15/24 (!) 158/74  08/23/22 (!) 162/68   Physical Exam Constitutional:      Appearance: He is obese.  Neck:     Vascular: No carotid bruit or JVD.  Cardiovascular:     Rate and Rhythm: Normal rate and regular rhythm.     Pulses: Intact distal pulses.     Heart sounds: Normal heart sounds. No murmur heard.    No gallop.   Pulmonary:     Effort: Pulmonary effort is normal.     Breath sounds: Normal breath sounds.  Abdominal:     General: Bowel sounds are normal.     Palpations: Abdomen is soft.  Musculoskeletal:     Right lower leg: No edema.     Left lower leg: No edema.     ASSESSMENT AND PLAN: .      ICD-10-CM   1. Intermediate coronary syndrome (HCC)  I20.0 aspirin  (ASPIRIN  CHILDRENS) 81 MG chewable tablet    CBC    Comprehensive metabolic panel with GFR    2. Coronary artery disease involving native coronary artery of native heart with unstable angina pectoris (HCC)  I25.110 EKG 12-Lead    amLODipine  (NORVASC ) 5 MG tablet    aspirin  (ASPIRIN  CHILDRENS) 81 MG chewable tablet    CBC    Comprehensive metabolic panel with GFR    CANCELED: Full code    3. Hypercholesterolemia  E78.00 CBC    Comprehensive metabolic panel with GFR    4. Type 2 diabetes mellitus with hyperglycemia, without long-term current use of insulin  (HCC)  E11.65 CBC    Comprehensive metabolic panel with GFR    5. Primary hypertension  I10 amLODipine  (NORVASC ) 5 MG tablet    hydrochlorothiazide  (HYDRODIURIL ) 25 MG tablet    CBC    Comprehensive metabolic panel with GFR     Assessment & Plan Intermediate coronary syndrome Intermittent chest tightness and heaviness, particularly when lying down, over the past two weeks. No exertional chest pain. Symptoms suggestive of heart failure and intermediate coronary syndrome. Concern for potential heart attack. New right bundle branch block on EKG. Differential includes heart failure and coronary artery disease. - Ordered heart catheterization to assess coronary artery grafts and potential blockages. - Ordered echocardiogram to evaluate heart function. - Advised against heavy physical activity and work until further evaluation. - Discussed risks of heart catheterization, including less than 1% risk of death, stroke, heart attack, or urgent bypass surgery. - Discussed potential  for stent placement if blockages are found during catheterization. 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  - Patient instructed not to do heavy lifting, heavy exertional activity, swimming until evaluation is complete.  Patient instructed to call if symptoms worse or to go to the ED for further evaluation.   Type 2 diabetes mellitus with hyperglycemia Recently diagnosed with type 2 diabetes mellitus, currently on metformin  500 mg twice daily since October. Family history of diabetes. - Continue metformin  500 mg twice daily. - Discussed need for better diabetes management.  Primary hypertension Blood pressure generally in the 140s, with variable control. Currently on losartan, taken around lunchtime. Previous adverse reaction to lisinopril . - Continue losartan  as prescribed. - Advised to take blood pressure medication consistently. - Added Amlodipine  5 mg daily and hydrochlorothiazide  25 mg in the morning and will combine losartan once he finishes his meds at home.   Follow up: Post cardiac catheterization  Signed,  Gordy Bergamo, MD, [facility] 03/16/2024, 8:53 AM [facility] [address] [place], [place] [address] Phone: [phone]. Fax:  [phone]  "

## 2024-03-15 NOTE — Telephone Encounter (Signed)
 Received critical lab result of K 5.8 / Hyperkalemia. No previous history of hyperK. Called patient, recommended ER and they will go for repeat lab check and treatment of HyperK if necessary.  Potassium  Date Value Ref Range Status  03/15/2024 5.8 (HH) 3.5 - 5.2 mmol/L Final    Comment:                      Client Requested Flag   Marlin Rhody, MD

## 2024-03-15 NOTE — Patient Instructions (Addendum)
 Medication Instructions:  Meds ordered this encounter  Medications   amLODipine  (NORVASC ) 5 MG tablet    Sig: Take 1 tablet (5 mg total) by mouth daily.    Dispense:  30 tablet    Refill:  2   hydrochlorothiazide  (HYDRODIURIL ) 25 MG tablet    Sig: Take 1 tablet (25 mg total) by mouth every morning.    Dispense:  90 tablet    Refill:  3   aspirin  (ASPIRIN  CHILDRENS) 81 MG chewable tablet    Sig: Chew 1 tablet (81 mg total) by mouth daily.     *If you need a refill on your cardiac medications before your next appointment, please call your pharmacy*  Lab Work: (TODAY) Lab Orders         CBC         Comprehensive metabolic panel with GFR      If you have labs (blood work) drawn today and your tests are completely normal, you will receive your results only by: MyChart Message (if you have MyChart) OR A paper copy in the mail If you have any lab test that is abnormal or we need to change your treatment, we will call you to review the results.  Follow-Up: At [facility], you and your health needs are our priority.  As part of our continuing mission to provide you with exceptional heart care, our providers are all part of one team.  This team includes your primary Cardiologist (physician) and Advanced Practice Providers or APPs (Physician Assistants and Nurse Practitioners) who all work together to provide you with the care you need, when you need it.  Your next appointment:   4 weeks  Provider:   Gordy Bergamo, MD       We recommend signing up for the patient portal called MyChart.  Patients are able to view lab/test results, encounter notes, upcoming appointments, etc.  Non-urgent messages can be sent to your provider as well, go to [email or web address].            Cardiac/Peripheral Catheterization   You are scheduled for a Cardiac Catheterization on Tuesday, February 3 with Dr. Gordy Bergamo.  1. Please arrive at the [facility] (Main Entrance A) at [facility]: [address] [place], [place] [address] at 8:30 AM (This time is 2 hour(s) before your procedure to ensure your preparation).   Free valet parking service is available. You will check in at ADMITTING. The support person will be asked to wait in the waiting room.  It is OK to have someone drop you off and come back when you are ready to be discharged.        Special note: Every effort is made to have your procedure done on time. Please understand that emergencies sometimes delay scheduled procedures.  2. Diet: Nothing to eat after midnight.  3. Hydration:You need to be well hydrated before your procedure. On February 3, you may drink approved liquids (see below) until 2 hours before the procedure, with 16 oz of water  as your last intake.   List of approved liquids water , clear juice, clear tea, black coffee, fruit juices, non-citric and without pulp, carbonated beverages, Gatorade, Kool -Aid, plain Jello-O and plain ice popsicles.  4. Labs: You will need to have blood drawn today (1/30).  5. Medication instructions in preparation for your procedure:   Stop taking, Cozaar (Losartan) Monday, February 2,, HTCZ (Hydrochlorothiazide ) Tuesday, February 3,   Do not take Diabetes Med Glucophage  (Metformin ) on  the day of the procedure and HOLD 48 HOURS AFTER THE PROCEDURE.  On the morning of your procedure, take Aspirin  81 mg and any morning medicines NOT listed above.  You may use sips of water .  6. Plan to go home the same day, you will only stay overnight if medically necessary. 7. You MUST have a responsible adult to drive you home. 8. An adult MUST be with you the first 24 hours after you arrive home. 9. Bring a current list of your medications, and the last time and date medication taken. 10. Bring ID and current insurance cards. 11.Please wear clothes that are easy to get on and off and wear slip-on shoes.  Thank you for allowing us  to care for you!   -- [facility]

## 2024-03-16 ENCOUNTER — Other Ambulatory Visit: Payer: Self-pay

## 2024-03-16 ENCOUNTER — Emergency Department

## 2024-03-16 ENCOUNTER — Emergency Department
Admission: EM | Admit: 2024-03-16 | Discharge: 2024-03-16 | Disposition: A | Discharge status: Home / Self Care | Attending: Emergency Medicine | Admitting: Emergency Medicine

## 2024-03-16 DIAGNOSIS — D72829 Elevated white blood cell count, unspecified: Secondary | ICD-10-CM | POA: Diagnosis not present

## 2024-03-16 DIAGNOSIS — I10 Essential (primary) hypertension: Secondary | ICD-10-CM | POA: Insufficient documentation

## 2024-03-16 DIAGNOSIS — Z951 Presence of aortocoronary bypass graft: Secondary | ICD-10-CM | POA: Insufficient documentation

## 2024-03-16 DIAGNOSIS — I2510 Atherosclerotic heart disease of native coronary artery without angina pectoris: Secondary | ICD-10-CM | POA: Insufficient documentation

## 2024-03-16 DIAGNOSIS — R0789 Other chest pain: Secondary | ICD-10-CM | POA: Insufficient documentation

## 2024-03-16 LAB — CBC
HCT: 48 % (ref 39.0–52.0)
Hemoglobin: 15.7 g/dL (ref 13.0–17.0)
MCH: 27.2 pg (ref 26.0–34.0)
MCHC: 32.7 g/dL (ref 30.0–36.0)
MCV: 83 fL (ref 80.0–100.0)
Platelets: 284 10*3/uL (ref 150–400)
RBC: 5.78 MIL/uL (ref 4.22–5.81)
RDW: 14.6 % (ref 11.5–15.5)
WBC: 12 10*3/uL — ABNORMAL HIGH (ref 4.0–10.5)
nRBC: 0 % (ref 0.0–0.2)

## 2024-03-16 LAB — BASIC METABOLIC PANEL WITH GFR
Anion gap: 11 (ref 5–15)
BUN: 21 mg/dL (ref 8–23)
CO2: 26 mmol/L (ref 22–32)
Calcium: 9.6 mg/dL (ref 8.9–10.3)
Chloride: 103 mmol/L (ref 98–111)
Creatinine, Ser: 1.08 mg/dL (ref 0.61–1.24)
GFR, Estimated: >60 mL/min
Glucose, Bld: 185 mg/dL — ABNORMAL HIGH (ref 70–99)
Potassium: 3.9 mmol/L (ref 3.5–5.1)
Sodium: 139 mmol/L (ref 135–145)

## 2024-03-16 LAB — TROPONIN T, HIGH SENSITIVITY
Troponin T High Sensitivity: 42 ng/L — ABNORMAL HIGH (ref 0–19)
Troponin T High Sensitivity: 43 ng/L — ABNORMAL HIGH (ref 0–19)

## 2024-03-16 NOTE — Discharge Instructions (Signed)
 Your same level is normal today.  Your heart enzymes are reassuring.  Follow-up with your regular doctor.  Return to the ER for any new or worsening chest discomfort or pain, shortness of breath, dizziness, or any other new or worsening symptoms that concern you.

## 2024-03-16 NOTE — ED Triage Notes (Signed)
 Pt to ed from home via POV for abnormal labs. Pt was called by his cardiologist who advised his K+ was 5.8 from what was collected today and he needed to be seen immediately and have it recollected. Pt is caox4, in no acute distress and was ambulatory to triage.

## 2024-03-16 NOTE — ED Provider Notes (Signed)
 "  [facility] Provider Note    Event Date/Time   First MD Initiated Contact with Patient 03/16/24 0134     (approximate)   History   abnormal labs   HPI  Joel Walters is a 74 y.o. male with a history of hypertension, hypercholesterolemia, multivessel CAD status post CABG who presents with concern for an elevated potassium level.  The patient states that he got a call from his cardiologist this evening stating that his potassium was elevated at 5.8 on his labs from earlier today and was instructed to go to the ED.  The patient denies any acute symptoms.  However he states that over the last few days he has had a couple of episodes of chest discomfort although no actual chest pain.  Denies feeling shortness of breath or dizziness.  He did not have an episode like this tonight.  He addressed this with a cardiologist.  I reviewed the past medical records and confirmed the cardiology visit from yesterday.   Physical Exam   Triage Vital Signs: ED Triage Vitals [03/16/24 0026]  Encounter Vitals Group     BP (!) 159/72     Girls Systolic BP Percentile      Girls Diastolic BP Percentile      Boys Systolic BP Percentile      Boys Diastolic BP Percentile      Pulse Rate 70     Resp 16     Temp 97.9 F (36.6 C)     Temp Source Oral     SpO2 96 %     Weight      Height 6' 1 (1.854 m)     Head Circumference      Peak Flow      Pain Score 0     Pain Loc      Pain Education      Exclude from Growth Chart     Most recent vital signs: Vitals:   03/16/24 0026 03/16/24 0305  BP: (!) 159/72 (!) 159/76  Pulse: 70 62  Resp: 16 18  Temp: 97.9 F (36.6 C) 98 F (36.7 C)  SpO2: 96% 95%    General: Alert, well-appearing, no distress.  CV:  Good peripheral perfusion.  Resp:  Normal effort.  Lungs CTAB. Abd:  No distention.  Other:  No peripheral edema.   ED Results / Procedures / Treatments   Labs (all labs ordered are listed, but only abnormal  results are displayed) Labs Reviewed  BASIC METABOLIC PANEL WITH GFR - Abnormal; Notable for the following components:      Result Value   Glucose, Bld 185 (*)    All other components within normal limits  CBC - Abnormal; Notable for the following components:   WBC 12.0 (*)    All other components within normal limits  TROPONIN T, HIGH SENSITIVITY - Abnormal; Notable for the following components:   Troponin T High Sensitivity 42 (*)    All other components within normal limits  TROPONIN T, HIGH SENSITIVITY - Abnormal; Notable for the following components:   Troponin T High Sensitivity 43 (*)    All other components within normal limits     EKG  ED ECG REPORT I, Waylon Cassis, the attending physician, personally viewed and interpreted this ECG.  Date: 03/16/2024 EKG Time: 0043 Rate: 70 Rhythm: normal sinus rhythm QRS Axis: Left axis Intervals: RBBB ST/T Wave abnormalities: normal Narrative Interpretation: no evidence of acute ischemia    RADIOLOGY  Chest x-ray: I independently viewed and interpreted the images; there is no focal consolidation or edema   PROCEDURES:  Critical Care performed: No  Procedures   MEDICATIONS ORDERED IN ED: Medications - No data to display   IMPRESSION / MDM / ASSESSMENT AND PLAN / ED COURSE  I reviewed the triage vital signs and the nursing notes.  74 year old male with PMH as noted above presents after he was called about hyperkalemia found on labs from earlier today.  The patient has also had a few episodes of intermittent chest discomfort over the last couple of days although is asymptomatic currently.  Physical exam is unremarkable for acute findings.  Differential diagnosis includes, but is not limited to, hyperkalemia, hemolysis or other spurious lab result.  I have a low suspicion for ACS based on the description of the patient's symptoms although he is at elevated risk.  Patient's presentation is most consistent with  acute presentation with potential threat to life or bodily function.  The patient is on the cardiac monitor to evaluate for evidence of arrhythmia and/or significant heart rate changes.  BMP is unremarkable.  Potassium is 3.9.  EKG shows no acute abnormalities.  CBC shows mild leukocytosis.  Initial troponin is borderline elevated at 42.  We will obtain a repeat.  ----------------------------------------- 3:18 AM on 03/16/2024 -----------------------------------------  Repeat troponin is unchanged at 43.  The patient remains asymptomatic.  He is stable for discharge home at this time.  I counseled him on the results of the workup and plan of care, and answered all of his questions.  I gave strict return precautions, and he expressed understanding.   FINAL CLINICAL IMPRESSION(S) / ED DIAGNOSES   Final diagnoses:  Chest discomfort     Rx / DC Orders   ED Discharge Orders     None        Note:  This document was prepared using Dragon voice recognition software and may include unintentional dictation errors.    Jacolyn Pae, MD 03/16/24 [phone]  "

## 2024-03-16 NOTE — ED Notes (Signed)
 Discharge paperwork discussed with patient, patient verbalized understanding of discharge instructions.

## 2024-03-18 ENCOUNTER — Telehealth: Payer: Self-pay | Admitting: *Deleted

## 2024-03-18 NOTE — Telephone Encounter (Signed)
 Call to patient to review procedure instructions, no answer, voicemail message.

## 2024-03-18 NOTE — Telephone Encounter (Signed)
 Cardiac Catheterization scheduled at [facility] for: Tuesday March 19, 2024 10:30 AM Arrival time [facility] Main Entrance A at: 8:30 AM  Diet: -Nothing to eat after midnight.  Hydration: -May drink clear liquids until 2 hours before the procedure.  Approved liquids: Water , clear tea, black coffee, fruit juices-non-citric and without pulp,Gatorade, plain Jello/popsicles.   -Please drink 16 oz of water  2 hours before procedure.  Medication instructions: -Hold:  Hydrochlorothiazide -AM of procedure  Metformin -day of procedure and 48 hours after procedure -Other usual morning medications can be taken including aspirin  81 mg.  Plan to go home the same day, you will only stay overnight if medically necessary.  You must have responsible adult to drive you home.  Someone must be with you the first 24 hours after you arrive home.  Left message for patient to call back to review instructions

## 2024-03-19 ENCOUNTER — Encounter (HOSPITAL_COMMUNITY): Admission: RE | Disposition: A | Payer: Self-pay | Source: Home / Self Care | Attending: Cardiology

## 2024-03-19 ENCOUNTER — Other Ambulatory Visit: Payer: Self-pay

## 2024-03-19 ENCOUNTER — Ambulatory Visit (HOSPITAL_COMMUNITY)
Admission: RE | Admit: 2024-03-19 | Discharge: 2024-03-19 | Disposition: A | Attending: Cardiology | Admitting: Cardiology

## 2024-03-19 ENCOUNTER — Other Ambulatory Visit (HOSPITAL_COMMUNITY): Payer: Self-pay

## 2024-03-19 ENCOUNTER — Encounter (HOSPITAL_COMMUNITY): Payer: Self-pay | Admitting: Cardiology

## 2024-03-19 DIAGNOSIS — I25700 Atherosclerosis of coronary artery bypass graft(s), unspecified, with unstable angina pectoris: Secondary | ICD-10-CM | POA: Insufficient documentation

## 2024-03-19 DIAGNOSIS — I2582 Chronic total occlusion of coronary artery: Secondary | ICD-10-CM | POA: Insufficient documentation

## 2024-03-19 DIAGNOSIS — Z951 Presence of aortocoronary bypass graft: Secondary | ICD-10-CM | POA: Insufficient documentation

## 2024-03-19 DIAGNOSIS — I10 Essential (primary) hypertension: Secondary | ICD-10-CM | POA: Insufficient documentation

## 2024-03-19 DIAGNOSIS — E7800 Pure hypercholesterolemia, unspecified: Secondary | ICD-10-CM | POA: Insufficient documentation

## 2024-03-19 DIAGNOSIS — Z7984 Long term (current) use of oral hypoglycemic drugs: Secondary | ICD-10-CM | POA: Insufficient documentation

## 2024-03-19 DIAGNOSIS — Z7902 Long term (current) use of antithrombotics/antiplatelets: Secondary | ICD-10-CM | POA: Insufficient documentation

## 2024-03-19 DIAGNOSIS — E1165 Type 2 diabetes mellitus with hyperglycemia: Secondary | ICD-10-CM | POA: Insufficient documentation

## 2024-03-19 DIAGNOSIS — Z833 Family history of diabetes mellitus: Secondary | ICD-10-CM | POA: Insufficient documentation

## 2024-03-19 DIAGNOSIS — Z7982 Long term (current) use of aspirin: Secondary | ICD-10-CM | POA: Insufficient documentation

## 2024-03-19 DIAGNOSIS — Z79899 Other long term (current) drug therapy: Secondary | ICD-10-CM | POA: Insufficient documentation

## 2024-03-19 DIAGNOSIS — R079 Chest pain, unspecified: Secondary | ICD-10-CM | POA: Insufficient documentation

## 2024-03-19 MED ORDER — HEPARIN (PORCINE) IN NACL 1000-0.9 UT/500ML-% IV SOLN
INTRAVENOUS | Status: DC | PRN
  Administered 2024-03-19: 1000 mL
  Administered 2024-03-19: 500 mL

## 2024-03-19 MED ORDER — SODIUM CHLORIDE 0.9 % IV SOLN: 250.0000 mL | INTRAVENOUS | Status: DC | PRN

## 2024-03-19 MED ORDER — METFORMIN HCL 500 MG PO TABS: 1000.0000 mg | ORAL_TABLET | Freq: Every day | ORAL | Status: AC

## 2024-03-19 MED ORDER — CLOPIDOGREL BISULFATE 300 MG PO TABS
ORAL_TABLET | ORAL | Status: DC | PRN
  Administered 2024-03-19: 600 mg via ORAL

## 2024-03-19 MED ORDER — IOHEXOL 350 MG/ML SOLN
INTRAVENOUS | Status: DC | PRN
  Administered 2024-03-19: 155 mL

## 2024-03-19 MED ORDER — NITROGLYCERIN 0.4 MG SL SUBL: 0.4000 mg | SUBLINGUAL_TABLET | SUBLINGUAL | 2 refills | Status: AC | PRN

## 2024-03-19 MED ORDER — FREE WATER: 500.0000 mL | Freq: Once | Status: DC

## 2024-03-19 MED ORDER — ASPIRIN 81 MG PO CHEW: 81.0000 mg | CHEWABLE_TABLET | ORAL | Status: DC

## 2024-03-19 MED ORDER — ONDANSETRON HCL 4 MG/2ML IJ SOLN: 4.0000 mg | Freq: Four times a day (QID) | INTRAMUSCULAR | Status: DC | PRN

## 2024-03-19 MED ORDER — VERAPAMIL HCL 2.5 MG/ML IV SOLN
INTRAVENOUS | Status: AC
  Filled 2024-03-19: qty 2

## 2024-03-19 MED ORDER — CLOPIDOGREL BISULFATE 75 MG PO TABS
75.0000 mg | ORAL_TABLET | Freq: Every day | ORAL | 1 refills | Status: AC
  Filled 2024-03-19: qty 90, 90d supply, fill #0

## 2024-03-19 MED ORDER — FENTANYL CITRATE (PF) 100 MCG/2ML IJ SOLN
INTRAMUSCULAR | Status: AC
  Filled 2024-03-19: qty 2

## 2024-03-19 MED ORDER — LIDOCAINE HCL (PF) 1 % IJ SOLN
INTRAMUSCULAR | Status: DC | PRN
  Administered 2024-03-19: 2 mL

## 2024-03-19 MED ORDER — CLOPIDOGREL BISULFATE 300 MG PO TABS
ORAL_TABLET | ORAL | Status: AC
  Filled 2024-03-19: qty 2

## 2024-03-19 MED ORDER — NITROGLYCERIN 1 MG/10 ML FOR IR/CATH LAB
INTRA_ARTERIAL | Status: AC
  Filled 2024-03-19: qty 10

## 2024-03-19 MED ORDER — ACETAMINOPHEN 325 MG PO TABS: 650.0000 mg | ORAL_TABLET | ORAL | Status: DC | PRN

## 2024-03-19 MED ORDER — HEPARIN SODIUM (PORCINE) 1000 UNIT/ML IJ SOLN
INTRAMUSCULAR | Status: DC | PRN
  Administered 2024-03-19: 4000 [IU] via INTRAVENOUS
  Administered 2024-03-19: 6000 [IU] via INTRAVENOUS

## 2024-03-19 MED ORDER — SODIUM CHLORIDE 0.9% FLUSH: 3.0000 mL | Freq: Two times a day (BID) | INTRAVENOUS | Status: DC

## 2024-03-19 MED ORDER — SODIUM CHLORIDE 0.9% FLUSH: 3.0000 mL | INTRAVENOUS | Status: DC | PRN

## 2024-03-19 MED ORDER — HEPARIN SODIUM (PORCINE) 1000 UNIT/ML IJ SOLN
INTRAMUSCULAR | Status: AC
  Filled 2024-03-19: qty 10

## 2024-03-19 MED ORDER — MIDAZOLAM HCL 2 MG/2ML IJ SOLN
INTRAMUSCULAR | Status: AC
  Filled 2024-03-19: qty 2

## 2024-03-19 MED ORDER — VERAPAMIL HCL 2.5 MG/ML IV SOLN
INTRAVENOUS | Status: DC | PRN
  Administered 2024-03-19: 10 mL via INTRA_ARTERIAL

## 2024-03-19 MED ORDER — NITROGLYCERIN 1 MG/10 ML FOR IR/CATH LAB
INTRA_ARTERIAL | Status: DC | PRN
  Administered 2024-03-19 (×3): 200 ug via INTRACORONARY

## 2024-03-19 MED ORDER — MIDAZOLAM HCL (PF) 2 MG/2ML IJ SOLN
INTRAMUSCULAR | Status: DC | PRN
  Administered 2024-03-19: 2 mg via INTRAVENOUS

## 2024-03-19 MED ORDER — FENTANYL CITRATE (PF) 100 MCG/2ML IJ SOLN
INTRAMUSCULAR | Status: DC | PRN
  Administered 2024-03-19: 25 ug via INTRAVENOUS

## 2024-03-19 MED ORDER — PANTOPRAZOLE SODIUM 40 MG PO TBEC
40.0000 mg | DELAYED_RELEASE_TABLET | Freq: Every day | ORAL | 1 refills | Status: AC | PRN
  Filled 2024-03-19: qty 30, 30d supply, fill #0

## 2024-03-19 MED ORDER — LIDOCAINE HCL (PF) 1 % IJ SOLN
INTRAMUSCULAR | Status: AC
  Filled 2024-03-19: qty 30

## 2024-03-19 NOTE — Progress Notes (Signed)
 Pt and wife received discharge instructions, teach back performed. Iv removed, no complications. Left radial site is clean dry intact, site is soft, no signs of bleeding. Pt escorted out via wheelchair to wife's vehicle.

## 2024-03-19 NOTE — Discharge Instructions (Signed)

## 2024-03-19 NOTE — Telephone Encounter (Signed)
 Cardiac cath done 03/19/24.

## 2024-03-19 NOTE — Interval H&P Note (Signed)
 History and Physical Interval Note:  03/19/2024 12:42 PM  Joel Walters  has presented today for surgery, with the diagnosis of cad.  The various methods of treatment have been discussed with the patient and family. After consideration of risks, benefits and other options for treatment, the patient has consented to  Procedures: LEFT HEART CATH AND CORS/GRAFTS ANGIOGRAPHY (N/A) and possible coronary angioplasty as a surgical intervention.  The patient's history has been reviewed, patient examined, no change in status, stable for surgery.  I have reviewed the patient's chart and labs.  Questions were answered to the patient's satisfaction.     Gordy Bergamo

## 2024-03-19 NOTE — Discharge Summary (Signed)
 "   Discharge Summary for Same Day PCI   Patient ID: Joel Walters MRN: [id number]; DOB: 26-Mar-1950  Admit date: 03/19/2024 Discharge date: 03/19/2024  Primary Care Provider: [facility]  Primary Cardiologist: Gordy Bergamo, MD  Primary Electrophysiologist:  None   Discharge Diagnoses    Active Problems:   Chest pain  Diagnostic Studies/Procedures    Cardiac Catheterization 03/19/2024:  Hemodynamic data: LVEDP 2 mmHg.  There is no pressure gradient across the aortic valve.   Angiographic data: LM: Large-caliber vessel, smooth and normal. LAD: Occluded in the proximal segment.  Distally supplied by LIMA.  LAD gives origin to a moderate sized D1. RI: Large-caliber vessel, smooth and normal. Cx: Large OM1 which is occluded in the ostium.  Small OM 2 and continues as a OM 3. RCA: Occluded in the proximal segment. SVG to PDA: Widely patent. SVG to D1: Widely patent. LIMA to LAD: Widely patent. SVG to OM1: Has a high-grade proximal 99% stenosis with TIMI II flow.  Successful stenting with 3.5 x 16 mm Synergy XD DES at 20 atmospheric pressure, 0% residual stenosis with improvement in TIMI II to TIMI-3 flow.        Impression and recommendations: Patient needs aggressive risk modification, DAPT for 6 months with aspirin  and Plavix . _____________   History of Present Illness     Joel Walters is a 74 y.o. male with hypertension, hypercholesterolemia, multivessel CAD SP CABG x 5 in 2012 with LIMA to LAD, SVG to D1, SVG to OM sequential SVG to [facility] and RPDA, last seen in our office in 2021, presents for reestablishing care in view of chest pain and episodes of dyspnea and PND. He reported episodes of chest tightness for the past 2 weeks prior to office visit on 1/30. Given concern for progressive CAD he was set up for outpatient cardiac cath.   Cardiac catheterization was arranged for further evaluation.  Hospital Course     The patient underwent cardiac cath as  noted above with successful PCI/DES of high grade proximal stenosis of 99% of SVG-OM1, treated with PCI/DES x1. Plan for DAPT with ASA/plavix  for at least 6 months. The patient was seen by cardiac rehab while in short stay. There were no observed complications post cath. Radial cath site was re-evaluated prior to discharge and found to be stable without any complications. Instructions/precautions regarding cath site care were given prior to discharge.  Marinda FORBES Sours was seen by Dr. Bergamo and determined stable for discharge home. Follow up with our office has been arranged. Medications are listed below. Pertinent changes include addition of plavix  and SL NTG.    _____________  Cath/PCI Registry Performance & Quality Measures: Aspirin  prescribed? - Yes ADP Receptor Inhibitor (Plavix /Clopidogrel , Brilinta/Ticagrelor or Effient/Prasugrel) prescribed (includes medically managed patients)? - Yes High Intensity Statin (Lipitor 40-80mg  or Crestor  20-40mg ) prescribed? - No - on pravastatin , intolerant to crestor /atorvastatin For EF <40%, was ACEI/ARB prescribed? - Not Applicable (EF >/= 40%) For EF <40%, Aldosterone Antagonist (Spironolactone or Eplerenone) prescribed? - Not Applicable (EF >/= 40%) Cardiac Rehab Phase II ordered (Included Medically managed Patients)? - Yes  _____________   Discharge Vitals Blood pressure 131/66, pulse 63, resp. rate 17, height 6' 1 (1.854 m), weight 117.5 kg, SpO2 92%.  Filed Weights   03/19/24 0859  Weight: 117.5 kg    Last Labs & Radiologic Studies    CBC No results for input(s): WBC, NEUTROABS, HGB, HCT, MCV, PLT in the last 72 hours. Basic Metabolic  Panel No results for input(s): NA, K, CL, CO2, GLUCOSE, BUN, CREATININE, CALCIUM , MG, PHOS in the last 72 hours. Liver Function Tests No results for input(s): AST, ALT, ALKPHOS, BILITOT, PROT, ALBUMIN  in the last 72 hours. No results for input(s): LIPASE,  AMYLASE in the last 72 hours. High Sensitivity Troponin:   No results for input(s): TROPONINIHS in the last 720 hours.  BNP Invalid input(s): POCBNP D-Dimer No results for input(s): DDIMER in the last 72 hours. Hemoglobin A1C No results for input(s): HGBA1C in the last 72 hours. Fasting Lipid Panel No results for input(s): CHOL, HDL, LDLCALC, TRIG, CHOLHDL, LDLDIRECT in the last 72 hours. Thyroid Function Tests No results for input(s): TSH, T4TOTAL, T3FREE, THYROIDAB in the last 72 hours.  Invalid input(s): FREET3 _____________  CARDIAC CATHETERIZATION Result Date: 03/19/2024 Images from the original result were not included. Cardiac Catheterization 03/19/24: Hemodynamic data: LVEDP 2 mmHg.  There is no pressure gradient across the aortic valve. Angiographic data: LM: Large-caliber vessel, smooth and normal. LAD: Occluded in the proximal segment.  Distally supplied by LIMA.  LAD gives origin to a moderate sized D1. RI: Large-caliber vessel, smooth and normal. Cx: Large OM1 which is occluded in the ostium.  Small OM 2 and continues as a OM 3. RCA: Occluded in the proximal segment. SVG to PDA: Widely patent. SVG to D1: Widely patent. LIMA to LAD: Widely patent. SVG to OM1: Has a high-grade proximal 99% stenosis with TIMI II flow.  Successful stenting with 3.5 x 16 mm Synergy XD DES at 20 atmospheric pressure, 0% residual stenosis with improvement in TIMI II to TIMI-3 flow. Impression and recommendations: Patient needs aggressive risk modification, DAPT for 6 months with aspirin  and Plavix .   DG Chest 2 View Result Date: 03/16/2024 EXAM: PA AND LATERAL (2) VIEW(S) XRAY OF THE CHEST 03/16/2024 12:51:07 AM COMPARISON: PA and lateral 03/10/2011. CLINICAL HISTORY: K+ 2.8 Potassium 2.8. FINDINGS: LUNGS AND PLEURA: There is a calcified granuloma in the left upper lobe. The lungs are otherwise clear. No pleural effusion. No pneumothorax. HEART AND MEDIASTINUM: Surgical  changes of a remote CABG are again noted. The cardiac size is normal. No vascular congestion is seen. 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 IMPRESSION: 1. No acute process. Electronically signed by: Francis Quam MD 03/16/2024 02:33 AM EST RP Workstation: [id number]    Disposition   Pt is being discharged home today in good condition.  Follow-up Plans & Appointments     Discharge Instructions     AMB Referral to Cardiac Rehabilitation - Phase II   Complete by: As directed    Diagnosis:  Coronary Stents Stable Angina     After initial evaluation and assessments completed: Virtual Based Care may be provided alone or in conjunction with Phase 2 Cardiac Rehab based on patient barriers.: Yes   Intensive Cardiac Rehabilitation (ICR) [facility] location only OR Traditional Cardiac Rehabilitation (TCR) *If criteria for ICR are not met will enroll in TCR [facility] only): Yes        Discharge Medications   Allergies as of 03/19/2024       Reactions   Lisinopril  Swelling   Codeine    REACTION: Jittery, itches   Crestor  [rosuvastatin ] Other (See Comments)   BROKE OUT PER PATIENT,HE STOPPED   Lipitor [atorvastatin]    Cramping        Medication List     STOP taking these medications    meloxicam 15 MG tablet Commonly known as:  MOBIC   omeprazole 20 MG capsule Commonly known as: PRILOSEC       TAKE these medications    amLODipine  5 MG tablet Commonly known as: NORVASC  Take 1 tablet (5 mg total) by mouth daily.   aspirin  81 MG chewable tablet Commonly known as: Aspirin  Childrens Chew 1 tablet (81 mg total) by mouth daily.   clopidogrel  75 MG tablet Commonly known as: Plavix  Take 1 tablet (75 mg total) by mouth daily.   ezetimibe  10 MG tablet Commonly known as: ZETIA  Take 1 tablet (10 mg total) by mouth daily.   fluticasone 50 MCG/ACT nasal spray Commonly known as: FLONASE Place 2 sprays into both nostrils daily  as needed for allergies.   hydrochlorothiazide  25 MG tablet Commonly known as: HYDRODIURIL  Take 1 tablet (25 mg total) by mouth every morning.   losartan 100 MG tablet Commonly known as: COZAAR Take 100 mg by mouth daily.   metFORMIN  500 MG tablet Commonly known as: GLUCOPHAGE  Take 2 tablets (1,000 mg total) by mouth daily with breakfast. Start taking on: March 22, 2024 What changed: These instructions start on March 22, 2024. If you are unsure what to do until then, ask your doctor or other care provider.   metoprolol  succinate 50 MG 24 hr tablet Commonly known as: TOPROL -XL Take 1 tablet (50 mg total) by mouth daily.   nitroGLYCERIN  0.4 MG SL tablet Commonly known as: Nitrostat  Place 1 tablet (0.4 mg total) under the tongue every 5 (five) minutes as needed.   pantoprazole  40 MG tablet Commonly known as: Protonix  Take 1 tablet (40 mg total) by mouth daily as needed (Acid reflux).   pravastatin  20 MG tablet Commonly known as: PRAVACHOL  TAKE 1 TABLET BY MOUTH EVERY DAY What changed: when to take this   PROBIOTIC PO Take 1 capsule by mouth daily.           Allergies Allergies[1]  Outstanding Labs/Studies   N/a   Duration of Discharge Encounter   Greater than 30 minutes including physician time.  Signed, Manuelita Rummer, NP 03/19/2024, 4:36 PM     [1]  Allergies Allergen Reactions   Lisinopril  Swelling   Codeine     REACTION: Jittery, itches   Crestor  [Rosuvastatin ] Other (See Comments)    BROKE OUT PER PATIENT,HE STOPPED   Lipitor [Atorvastatin]     Cramping   "

## 2024-03-20 ENCOUNTER — Telehealth (HOSPITAL_COMMUNITY): Payer: Self-pay | Admitting: *Deleted

## 2024-03-20 DIAGNOSIS — Z955 Presence of coronary angioplasty implant and graft: Secondary | ICD-10-CM

## 2024-03-20 LAB — POCT ACTIVATED CLOTTING TIME
Activated Clotting Time: 230 s
Activated Clotting Time: 363 s

## 2024-03-20 NOTE — Telephone Encounter (Signed)
 LVM regarding Cardiac Rehab education. Will send information in mail and place referral for CRPII to [facility]. Aliene Aris BS, ACSM-CEP 03/20/2024 3:29 PM

## 2024-04-12 ENCOUNTER — Ambulatory Visit: Admitting: Cardiology
